# Patient Record
Sex: Female | Born: 1950
Health system: Southern US, Community
[De-identification: ages and names within clinical notes are randomized; demographics above are authoritative.]

## PROBLEM LIST (undated history)

## (undated) DIAGNOSIS — R7309 Other abnormal glucose: Secondary | ICD-10-CM

## (undated) DIAGNOSIS — R7989 Other specified abnormal findings of blood chemistry: Secondary | ICD-10-CM

## (undated) DIAGNOSIS — E785 Hyperlipidemia, unspecified: Secondary | ICD-10-CM

## (undated) DIAGNOSIS — M810 Age-related osteoporosis without current pathological fracture: Secondary | ICD-10-CM

## (undated) DIAGNOSIS — R748 Abnormal levels of other serum enzymes: Secondary | ICD-10-CM

## (undated) DIAGNOSIS — M109 Gout, unspecified: Secondary | ICD-10-CM

## (undated) DIAGNOSIS — I10 Essential (primary) hypertension: Secondary | ICD-10-CM

## (undated) DIAGNOSIS — H9193 Unspecified hearing loss, bilateral: Secondary | ICD-10-CM

## (undated) DIAGNOSIS — N904 Leukoplakia of vulva: Secondary | ICD-10-CM

## (undated) DIAGNOSIS — E039 Hypothyroidism, unspecified: Secondary | ICD-10-CM

## (undated) DIAGNOSIS — L723 Sebaceous cyst: Secondary | ICD-10-CM

## (undated) HISTORY — DX: Gout, unspecified: M10.9

## (undated) HISTORY — DX: Other abnormal glucose: R73.09

## (undated) HISTORY — DX: Unspecified hearing loss, bilateral: H91.93

## (undated) HISTORY — DX: Hyperlipidemia, unspecified: E78.5

## (undated) HISTORY — DX: Other specified abnormal findings of blood chemistry: R79.89

## (undated) HISTORY — DX: Age-related osteoporosis without current pathological fracture: M81.0

## (undated) HISTORY — PX: OTHER SURGICAL HISTORY: SHX169

## (undated) HISTORY — DX: Sebaceous cyst: L72.3

## (undated) HISTORY — DX: Essential (primary) hypertension: I10

## (undated) HISTORY — DX: Abnormal levels of other serum enzymes: R74.8

## (undated) HISTORY — DX: Hypothyroidism, unspecified: E03.9

## (undated) HISTORY — DX: Leukoplakia of vulva: N90.4

---

## 1979-09-25 HISTORY — PX: TOTAL ABDOMINAL HYSTERECTOMY: SHX209

## 1983-09-25 HISTORY — PX: BARTHOLIN GLAND CYST EXCISION: SHX565

## 1999-07-27 ENCOUNTER — Other Ambulatory Visit: Admission: RE | Admit: 1999-07-27 | Discharge: 1999-07-27 | Payer: Self-pay | Admitting: Obstetrics and Gynecology

## 1999-09-06 ENCOUNTER — Encounter: Payer: Self-pay | Admitting: Obstetrics and Gynecology

## 1999-09-06 ENCOUNTER — Encounter: Admission: RE | Admit: 1999-09-06 | Discharge: 1999-09-06 | Payer: Self-pay | Admitting: Obstetrics and Gynecology

## 2000-08-21 ENCOUNTER — Other Ambulatory Visit: Admission: RE | Admit: 2000-08-21 | Discharge: 2000-08-21 | Payer: Self-pay | Admitting: Obstetrics and Gynecology

## 2000-09-11 ENCOUNTER — Encounter: Admission: RE | Admit: 2000-09-11 | Discharge: 2000-09-11 | Payer: Self-pay | Admitting: Obstetrics and Gynecology

## 2000-09-11 ENCOUNTER — Encounter: Payer: Self-pay | Admitting: Obstetrics and Gynecology

## 2001-08-27 ENCOUNTER — Other Ambulatory Visit: Admission: RE | Admit: 2001-08-27 | Discharge: 2001-08-27 | Payer: Self-pay | Admitting: Obstetrics and Gynecology

## 2001-09-19 ENCOUNTER — Encounter: Admission: RE | Admit: 2001-09-19 | Discharge: 2001-09-19 | Payer: Self-pay | Admitting: Obstetrics and Gynecology

## 2001-09-19 ENCOUNTER — Encounter: Payer: Self-pay | Admitting: Obstetrics and Gynecology

## 2004-12-22 ENCOUNTER — Encounter: Admission: RE | Admit: 2004-12-22 | Discharge: 2004-12-22 | Payer: Self-pay | Admitting: Obstetrics and Gynecology

## 2006-02-20 ENCOUNTER — Encounter: Admission: RE | Admit: 2006-02-20 | Discharge: 2006-02-20 | Payer: Self-pay | Admitting: Obstetrics and Gynecology

## 2007-05-14 ENCOUNTER — Encounter: Admission: RE | Admit: 2007-05-14 | Discharge: 2007-05-14 | Payer: Self-pay | Admitting: Obstetrics and Gynecology

## 2007-07-30 ENCOUNTER — Encounter: Admission: RE | Admit: 2007-07-30 | Discharge: 2007-07-30 | Payer: Self-pay | Admitting: Obstetrics and Gynecology

## 2008-06-16 ENCOUNTER — Encounter: Admission: RE | Admit: 2008-06-16 | Discharge: 2008-06-16 | Payer: Self-pay | Admitting: Obstetrics and Gynecology

## 2009-07-13 ENCOUNTER — Encounter: Admission: RE | Admit: 2009-07-13 | Discharge: 2009-07-13 | Payer: Self-pay | Admitting: Obstetrics and Gynecology

## 2009-10-25 LAB — HM COLONOSCOPY: HM Colonoscopy: NORMAL

## 2009-10-25 LAB — HM DEXA SCAN: HM Dexa Scan: NORMAL

## 2009-11-02 ENCOUNTER — Encounter: Admission: RE | Admit: 2009-11-02 | Discharge: 2009-11-02 | Payer: Self-pay | Admitting: Obstetrics and Gynecology

## 2010-07-14 ENCOUNTER — Encounter: Admission: RE | Admit: 2010-07-14 | Discharge: 2010-07-14 | Payer: Self-pay | Admitting: Obstetrics and Gynecology

## 2010-10-16 ENCOUNTER — Encounter: Payer: Self-pay | Admitting: Obstetrics and Gynecology

## 2011-06-18 ENCOUNTER — Other Ambulatory Visit: Payer: Self-pay | Admitting: Obstetrics and Gynecology

## 2011-06-18 DIAGNOSIS — Z1231 Encounter for screening mammogram for malignant neoplasm of breast: Secondary | ICD-10-CM

## 2011-07-18 ENCOUNTER — Ambulatory Visit: Payer: Self-pay

## 2011-07-25 ENCOUNTER — Ambulatory Visit
Admission: RE | Admit: 2011-07-25 | Discharge: 2011-07-25 | Disposition: A | Discharge status: Home / Self Care | Payer: BC Managed Care – PPO | Source: Ambulatory Visit | Attending: Obstetrics and Gynecology | Admitting: Obstetrics and Gynecology

## 2011-07-25 DIAGNOSIS — Z1231 Encounter for screening mammogram for malignant neoplasm of breast: Secondary | ICD-10-CM

## 2011-12-25 ENCOUNTER — Other Ambulatory Visit: Payer: Self-pay | Admitting: Obstetrics and Gynecology

## 2012-01-04 ENCOUNTER — Ambulatory Visit
Admission: RE | Admit: 2012-01-04 | Discharge: 2012-01-04 | Disposition: A | Discharge status: Home / Self Care | Payer: BC Managed Care – PPO | Source: Ambulatory Visit | Attending: Obstetrics and Gynecology | Admitting: Obstetrics and Gynecology

## 2012-06-16 ENCOUNTER — Other Ambulatory Visit: Payer: Self-pay | Admitting: Obstetrics and Gynecology

## 2012-06-16 DIAGNOSIS — Z1231 Encounter for screening mammogram for malignant neoplasm of breast: Secondary | ICD-10-CM

## 2012-07-31 ENCOUNTER — Ambulatory Visit
Admission: RE | Admit: 2012-07-31 | Discharge: 2012-07-31 | Disposition: A | Discharge status: Home / Self Care | Payer: BC Managed Care – PPO | Source: Ambulatory Visit | Attending: Obstetrics and Gynecology | Admitting: Obstetrics and Gynecology

## 2012-07-31 DIAGNOSIS — Z1231 Encounter for screening mammogram for malignant neoplasm of breast: Secondary | ICD-10-CM

## 2012-08-05 ENCOUNTER — Other Ambulatory Visit: Payer: Self-pay | Admitting: Obstetrics and Gynecology

## 2012-08-05 DIAGNOSIS — R928 Other abnormal and inconclusive findings on diagnostic imaging of breast: Secondary | ICD-10-CM

## 2012-08-08 ENCOUNTER — Ambulatory Visit
Admission: RE | Admit: 2012-08-08 | Discharge: 2012-08-08 | Disposition: A | Discharge status: Home / Self Care | Payer: BC Managed Care – PPO | Source: Ambulatory Visit | Attending: Obstetrics and Gynecology | Admitting: Obstetrics and Gynecology

## 2012-08-08 DIAGNOSIS — R928 Other abnormal and inconclusive findings on diagnostic imaging of breast: Secondary | ICD-10-CM

## 2012-08-08 LAB — HM MAMMOGRAPHY

## 2012-11-25 ENCOUNTER — Encounter: Payer: Self-pay | Admitting: Obstetrics and Gynecology

## 2012-12-17 ENCOUNTER — Telehealth: Payer: Self-pay | Admitting: Obstetrics and Gynecology

## 2012-12-24 ENCOUNTER — Encounter: Payer: Self-pay | Admitting: Obstetrics and Gynecology

## 2012-12-24 ENCOUNTER — Ambulatory Visit (INDEPENDENT_AMBULATORY_CARE_PROVIDER_SITE_OTHER): Payer: BC Managed Care – PPO | Admitting: Obstetrics and Gynecology

## 2012-12-24 VITALS — BP 128/76 | Ht 63.75 in | Wt 191.0 lb

## 2012-12-24 DIAGNOSIS — Z01419 Encounter for gynecological examination (general) (routine) without abnormal findings: Secondary | ICD-10-CM

## 2012-12-24 DIAGNOSIS — I10 Essential (primary) hypertension: Secondary | ICD-10-CM

## 2012-12-24 DIAGNOSIS — H9193 Unspecified hearing loss, bilateral: Secondary | ICD-10-CM

## 2012-12-24 DIAGNOSIS — Z9079 Acquired absence of other genital organ(s): Secondary | ICD-10-CM

## 2012-12-24 DIAGNOSIS — E7800 Pure hypercholesterolemia, unspecified: Secondary | ICD-10-CM | POA: Insufficient documentation

## 2012-12-24 DIAGNOSIS — Z90710 Acquired absence of both cervix and uterus: Secondary | ICD-10-CM

## 2012-12-24 DIAGNOSIS — H9190 Unspecified hearing loss, unspecified ear: Secondary | ICD-10-CM

## 2012-12-24 NOTE — Patient Instructions (Signed)

## 2012-12-24 NOTE — Progress Notes (Signed)
62 y.o. Divorced but now married Caucasian female   G2P2002 here for annual exam.    Patient's last menstrual period was 09/25/1979.          Sexually active: yes  The current method of family planning is status post hysterectomy.    Exercising:  not reguarly Last mammogram:  08/08/2012 benign calcification Lt breast Last pap smear: 2002 nl History of abnormal pap:  none Smoking:no Alcohol: 1-2 times a year Last colonoscopy: 10/2009 nl every 52yrs Last Bone Density:  01/04/2012 -2.2 at spine   -2.0 at hip Last tetanus shot: 2012 Last cholesterol check: 12/19/12  Hgb:  pcp              Urine:pcp    Health Maintenance  Topic Date Due  . Influenza Vaccine  1951-08-06  . Pap Smear  06/10/1968  . Tetanus/tdap  06/10/1969  . Colonoscopy  06/10/2000  . Zostavax  06/10/2010  . Mammogram  08/08/2014    Family History  Problem Relation Age of Onset  . Lymphoma Mother     Non Hodgkins  . Hypertension Mother   . Alzheimer's disease Mother   . ALS Father   . Thrombophlebitis Other     There is no problem list on file for this patient.   Past Medical History  Diagnosis Date  . Hypertension   . Hyperlipidemia   . Osteoporosis   . Hearing loss of both ears     Right ear hears less well.  . Hypothyroidism     Not on medication.    Past Surgical History  Procedure Laterality Date  . Bartholin gland removal N/A   . Total abdominal hysterectomy  1981    right salpingo-oophorectomy and appendectomy - dysfunctional uterine bleeding    Allergies: Review of patient's allergies indicates no known allergies.  Current Outpatient Prescriptions  Medication Sig Dispense Refill  . atenolol (TENORMIN) 50 MG tablet Take 100 mg by mouth 2 (two) times daily.      . Calcium Carbonate-Vitamin D (CALCIUM-VITAMIN D) 500-200 MG-UNIT per tablet Take 1 tablet by mouth 2 (two) times daily with a meal.      . cetirizine (ZYRTEC) 10 MG tablet Take 10 mg by mouth daily. Dosage not listed.      .  hydrochlorothiazide (HYDRODIURIL) 25 MG tablet Take 25 mg by mouth daily.      . Multiple Vitamin (MULTIVITAMIN) tablet Take 1 tablet by mouth daily.      . pantoprazole (PROTONIX) 40 MG tablet Take 40 mg by mouth daily.      . simvastatin (ZOCOR) 20 MG tablet Take 20 mg by mouth every evening.       No current facility-administered medications for this visit.    ROS: Pertinent items are noted in HPI.  Exam:    BP 128/76  Ht 5' 3.75" (1.619 m)  Wt 191 lb (86.637 kg)  BMI 33.05 kg/m2  LMP 09/25/1979  Up 7 pounds since last year Wt Readings from Last 3 Encounters:  12/24/12 191 lb (86.637 kg)     Ht Readings from Last 3 Encounters:  12/24/12 5' 3.75" (1.619 m)  Height stable since last year   General appearance: alert, cooperative and appears stated age Head: Normocephalic, without obvious abnormality, atraumatic Neck: no adenopathy, supple, symmetrical, trachea midline and thyroid not enlarged, symmetric, no tenderness/mass/nodules Lungs: clear to auscultation bilaterally Breasts: Inspection negative, No nipple retraction or dimpling, No nipple discharge or bleeding, No axillary or supraclavicular adenopathy, Normal to palpation  without dominant masses Heart: regular rate and rhythm Abdomen: soft, non-tender; bowel sounds normal; no masses,  no organomegaly Extremities: extremities normal, atraumatic, no cyanosis or edema Skin: Skin color, texture, turgor normal. No rashes or lesions Lymph nodes: Cervical, supraclavicular, and axillary nodes normal. No abnormal inguinal nodes palpated Neurologic: Grossly normal   Pelvic: External genitalia:  no lesions              Urethra:  normal appearing urethra with no masses, tenderness or lesions              Bartholins and Skenes: normal                 Vagina: normal appearing vagina with normal color and discharge, no lesions                          Pap taken: no        Bimanual Exam:   Adnexa: normal adnexa in size, nontender  and no masses                                      Rectovaginal: Confirms                                      Anus:  normal sphincter tone, no lesions  A: normal menopausal exam, not on HRT.        S/p TAH/LSO/Appy for DUB      bilat hearing loss, wears hearing aids      Osteoporosis, stable since 2008     P: mammogram counseled on breast self exam, mammography screening, osteoporosis, adequate intake of calcium and vitamin D, diet and exercise return annually or prn     An After Visit Summary was printed and given to the patient.

## 2012-12-30 ENCOUNTER — Other Ambulatory Visit: Payer: Self-pay | Admitting: Obstetrics and Gynecology

## 2012-12-30 DIAGNOSIS — R921 Mammographic calcification found on diagnostic imaging of breast: Secondary | ICD-10-CM

## 2013-01-29 ENCOUNTER — Ambulatory Visit
Admission: RE | Admit: 2013-01-29 | Discharge: 2013-01-29 | Disposition: A | Discharge status: Home / Self Care | Payer: BC Managed Care – PPO | Source: Ambulatory Visit | Attending: Obstetrics and Gynecology | Admitting: Obstetrics and Gynecology

## 2013-01-29 DIAGNOSIS — R921 Mammographic calcification found on diagnostic imaging of breast: Secondary | ICD-10-CM

## 2013-02-24 ENCOUNTER — Telehealth: Payer: Self-pay | Admitting: *Deleted

## 2013-03-21 ENCOUNTER — Encounter: Payer: Self-pay | Admitting: Obstetrics and Gynecology

## 2013-07-09 ENCOUNTER — Other Ambulatory Visit: Payer: Self-pay | Admitting: Gynecology

## 2013-07-09 DIAGNOSIS — R921 Mammographic calcification found on diagnostic imaging of breast: Secondary | ICD-10-CM

## 2013-07-30 ENCOUNTER — Other Ambulatory Visit: Payer: Self-pay

## 2013-08-03 ENCOUNTER — Telehealth: Payer: Self-pay | Admitting: Nurse Practitioner

## 2013-08-03 NOTE — Telephone Encounter (Signed)
Spoke with patient. She has a hx of bartholin's cysts that was infected "about 20 years ago". Patient having 3 days of vaginal pain and soreness. No fevers. No drainage, no vaginal discharge. Appointment with Lauro Franklin, FNP Scheduled for 11/11. Routing to provider for final review. Patient agreeable to disposition. Will close encounter

## 2013-08-03 NOTE — Telephone Encounter (Signed)
Patient calling thinking she may possibly have an infected bartholn gland. Please advise.

## 2013-08-04 ENCOUNTER — Ambulatory Visit (INDEPENDENT_AMBULATORY_CARE_PROVIDER_SITE_OTHER): Payer: BC Managed Care – PPO | Admitting: Nurse Practitioner

## 2013-08-04 ENCOUNTER — Encounter: Payer: Self-pay | Admitting: Nurse Practitioner

## 2013-08-04 VITALS — BP 120/84 | HR 68 | Temp 98.1°F | Ht 63.75 in | Wt 196.0 lb

## 2013-08-04 DIAGNOSIS — L723 Sebaceous cyst: Secondary | ICD-10-CM

## 2013-08-04 MED ORDER — CEPHALEXIN 500 MG PO CAPS: 500.0000 mg | ORAL_CAPSULE | Freq: Two times a day (BID) | ORAL | Status: DC

## 2013-08-04 MED ORDER — CEPHALEXIN 500 MG PO CAPS: ORAL_CAPSULE | ORAL | Status: DC

## 2013-08-04 NOTE — Patient Instructions (Signed)

## 2013-08-04 NOTE — Progress Notes (Signed)
Subjective:     Patient ID: Kristin Houston, female   DOB: 07-13-1951, 62 y.o.   MRN: [id number]  HPI   This 62 yo WM Fe presents with a swollen area on right labia since Saturday November 1st..  She states no trauma, no change in personal products, or recent SA.  She went shopping on Saturday but felt no pain.   Later in the day felt like something was wrong.  By Sunday am the labia was quite swollen and painful.  Yesterday she did apply warm compress and a sitz bath and is somewhat smaller.  She has no history of HSV or exposure risk.  She did have a left Bartholin gland cyst 20 years ago. States this feels different.  Denies urinary symptoms, fever/ chills.   Review of Systems  Constitutional: Negative for fever, chills and fatigue.  HENT: Negative.   Respiratory: Negative.   Cardiovascular: Negative.   Gastrointestinal: Negative.  Negative for nausea, vomiting, abdominal pain, diarrhea, constipation, blood in stool and rectal pain.  Genitourinary: Positive for genital sores. Negative for dysuria, urgency, frequency, hematuria, flank pain, decreased urine volume, vaginal bleeding, vaginal discharge, vaginal pain, menstrual problem and pelvic pain.  Musculoskeletal: Negative.   Skin: Positive for wound.  Neurological: Negative.   Psychiatric/Behavioral: Negative.        Objective:   Physical Exam  Constitutional: She is oriented to person, place, and time. She appears well-developed and well-nourished. No distress.  Pulmonary/Chest: Effort normal.  Abdominal: Soft. She exhibits no distension. There is no tenderness. There is no rebound and no guarding.  Genitourinary:     Right labia with a sebaceous cyst about 1.5 cm size that is soft but no exudate can be expressed.  She has swelling at 2 cm size in circumference. Normal Bartholin glands bilaterally.  No vaginal discharge.  One small area is opened to obtain a culture with small amount of exudate expressed.  Wound culture sent to  lab.  Neurological: She is alert and oriented to person, place, and time.  Psychiatric: She has a normal mood and affect. Her behavior is normal. Judgment and thought content normal.       Assessment:     Sebaceous cyst right labia    Plan:     Warm compresses and sitz bath 4 X daily Will start on Keflex 500 mg BID for a week. Will call her with culture results and get a progress report. She is to call if symptoms worsens in any way.

## 2013-08-06 NOTE — Progress Notes (Signed)
Encounter reviewed by Dr. Drayk Humbarger Silva.  

## 2013-08-07 LAB — WOUND CULTURE: Organism ID, Bacteria: NORMAL

## 2013-08-10 NOTE — Telephone Encounter (Signed)
Spoke with patient. She feels much improved. Advised of wound culture results. Patient will continue on rx and call back if anything changes.

## 2013-08-10 NOTE — Telephone Encounter (Signed)
Patient calling for results please?

## 2013-08-10 NOTE — Telephone Encounter (Signed)
Result Note    Let patient know no MRSA - get a progress report the med's she is on should be helping. If not needs another look, or if still swollen needs another look.      Attached to    WOUND CULTURE (Order# [id number])                  Wound culture   Status: Final result Visible to patient: This result is not viewable by the patient. Next appt: 09/30/2013 at 02:45 PM in Radiology ([facility]) Dx: Sebaceous cyst         Notes Recorded by Lauro Franklin, FNP on 08/06/2013 at 5:29 PM Let patient know no MRSA - get a progress report the med's she is on should be helping. If not needs another look, or if still swollen needs another look.

## 2013-09-28 ENCOUNTER — Other Ambulatory Visit: Payer: Self-pay | Admitting: Obstetrics and Gynecology

## 2013-09-28 DIAGNOSIS — R921 Mammographic calcification found on diagnostic imaging of breast: Secondary | ICD-10-CM

## 2013-09-30 ENCOUNTER — Ambulatory Visit
Admission: RE | Admit: 2013-09-30 | Discharge: 2013-09-30 | Disposition: A | Discharge status: Home / Self Care | Payer: BC Managed Care – PPO | Source: Ambulatory Visit | Attending: Gynecology | Admitting: Gynecology

## 2013-09-30 DIAGNOSIS — R921 Mammographic calcification found on diagnostic imaging of breast: Secondary | ICD-10-CM

## 2013-12-29 ENCOUNTER — Encounter: Payer: Self-pay | Admitting: Nurse Practitioner

## 2013-12-31 ENCOUNTER — Encounter: Payer: Self-pay | Admitting: Nurse Practitioner

## 2013-12-31 ENCOUNTER — Ambulatory Visit (INDEPENDENT_AMBULATORY_CARE_PROVIDER_SITE_OTHER): Payer: BC Managed Care – PPO | Admitting: Nurse Practitioner

## 2013-12-31 VITALS — BP 126/82 | HR 68 | Ht 64.5 in | Wt 193.0 lb

## 2013-12-31 DIAGNOSIS — Z01419 Encounter for gynecological examination (general) (routine) without abnormal findings: Secondary | ICD-10-CM

## 2013-12-31 DIAGNOSIS — Z0000 Encounter for general adult medical examination without abnormal findings: Secondary | ICD-10-CM

## 2013-12-31 DIAGNOSIS — Z1211 Encounter for screening for malignant neoplasm of colon: Secondary | ICD-10-CM

## 2013-12-31 DIAGNOSIS — R82998 Other abnormal findings in urine: Secondary | ICD-10-CM

## 2013-12-31 DIAGNOSIS — R8290 Unspecified abnormal findings in urine: Secondary | ICD-10-CM

## 2013-12-31 LAB — POCT URINALYSIS DIPSTICK
Bilirubin, UA: NEGATIVE
Glucose, UA: NEGATIVE
KETONES UA: NEGATIVE
Nitrite, UA: NEGATIVE
Protein, UA: NEGATIVE
RBC UA: NEGATIVE
UROBILINOGEN UA: NEGATIVE
pH, UA: 6

## 2013-12-31 LAB — HEMOGLOBIN, FINGERSTICK: Hemoglobin, fingerstick: 15.8 g/dL (ref 12.0–16.0)

## 2013-12-31 NOTE — Progress Notes (Addendum)
Patient ID: Kristin Houston, female   DOB: 26-Feb-1951, 63 y.o.   MRN: [id number] 63 y.o. G32P2002 Married Caucasian Fe here for annual exam.  No new health issues.  No vaginal dryness. No urinary symptoms.  Patient's last menstrual period was 09/25/1979.          Sexually active: yes  The current method of family planning is status post hysterectomy.    Exercising: no  The patient does not participate in regular exercise at present. Smoker:  no  Health Maintenance: Pap:  TAH 1981 MMG:  09/30/13, Bi-Rads 1: negative Colonoscopy:  2006, normal, repeat in 10 years IFOB is given BMD:  01/04/12, -2.2/-2.0 TDAP: 2012 Labs:  HB:  15.8 Urine:  2+ leuk's   reports that she has never smoked. She does not have any smokeless tobacco history on file. She reports that she drinks alcohol. She reports that she does not use illicit drugs.  Past Medical History  Diagnosis Date  . Hypertension   . Hyperlipidemia   . Osteoporosis   . Hearing loss of both ears     Right ear hears less well.  . Hypothyroidism     Not on medication.    Past Surgical History  Procedure Laterality Date  . Bartholin gland cyst excision N/A 1985  . Total abdominal hysterectomy  1981    left salpingo-oophorectomy and appendectomy - dysfunctional uterine bleeding    Current Outpatient Prescriptions  Medication Sig Dispense Refill  . atenolol (TENORMIN) 50 MG tablet Take 100 mg by mouth 2 (two) times daily.      . Calcium Carbonate-Vitamin D (CALCIUM-VITAMIN D) 500-200 MG-UNIT per tablet Take 1 tablet by mouth 2 (two) times daily with a meal.      . cetirizine (ZYRTEC) 10 MG tablet Take 10 mg by mouth daily. Dosage not listed.      . hydrochlorothiazide (HYDRODIURIL) 25 MG tablet Take 25 mg by mouth daily.      . Multiple Vitamin (MULTIVITAMIN) tablet Take 1 tablet by mouth daily.      . pantoprazole (PROTONIX) 40 MG tablet Take 40 mg by mouth daily.      . simvastatin (ZOCOR) 20 MG tablet Take 20 mg by mouth every  evening.       No current facility-administered medications for this visit.    Family History  Problem Relation Age of Onset  . Lymphoma Mother     Non Hodgkins  . Hypertension Mother   . Alzheimer's disease Mother   . Osteoporosis Mother   . ALS Father   . Thrombophlebitis Other     ROS:  Pertinent items are noted in HPI.  Otherwise, a comprehensive ROS was negative.  Exam:   BP 126/82  Pulse 68  Ht 5' 4.5" (1.638 m)  Wt 193 lb (87.544 kg)  BMI 32.63 kg/m2  LMP 09/25/1979 Height: 5' 4.5" (163.8 cm)  Ht Readings from Last 3 Encounters:  12/31/13 5' 4.5" (1.638 m)  08/04/13 5' 3.75" (1.619 m)  12/24/12 5' 3.75" (1.619 m)    General appearance: alert, cooperative and appears stated age Head: Normocephalic, without obvious abnormality, atraumatic Neck: no adenopathy, supple, symmetrical, trachea midline and thyroid normal to inspection and palpation Lungs: clear to auscultation bilaterally Breasts: normal appearance, no masses or tenderness Heart: regular rate and rhythm Abdomen: soft, non-tender; no masses,  no organomegaly Extremities: extremities normal, atraumatic, no cyanosis or edema Skin: Skin color, texture, turgor normal. No rashes or lesions Lymph nodes: Cervical, supraclavicular, and axillary nodes  normal. No abnormal inguinal nodes palpated Neurologic: Grossly normal   Pelvic: External genitalia:  no lesions              Urethra:  normal appearing urethra with no masses, tenderness or lesions              Bartholin's and Skene's: normal                 Vagina: normal appearing vagina with normal color and discharge, no lesions              Cervix: absent              Pap taken: no Bimanual Exam:  Uterus:  uterus absent              Adnexa: no mass, fullness, tenderness               Rectovaginal: Confirms               Anus:  normal sphincter tone, no lesions  A:  Well Woman with normal exam  S/P TAH, LSO secondary to AUB 1981 - ERT about 5 years then  dc 12/02  HTN, GERD  Osteoporosis improved now at osteopenia on conservative management, no bisphosphonate  Bilateral hearing loss with aids  R/O UTI asymptomatic - no treatment started  P:   Pap smear as per guidelines not done  Will send urine culture and follow   Mammogram due 1/16  IFOB given  Counseled on breast self exam, mammography screening, osteoporosis, adequate intake of calcium and vitamin D, diet and exercise return annually or prn  An After Visit Summary was printed and given to the patient.

## 2013-12-31 NOTE — Patient Instructions (Signed)

## 2014-01-01 ENCOUNTER — Ambulatory Visit: Payer: BC Managed Care – PPO | Admitting: Obstetrics and Gynecology

## 2014-01-01 LAB — URINE CULTURE: Colony Count: 70000

## 2014-01-04 ENCOUNTER — Telehealth: Payer: Self-pay | Admitting: *Deleted

## 2014-01-04 NOTE — Telephone Encounter (Signed)
I have attempted to contact this patient by phone with the following results: left message to return my call on answering machine (home/mobile per DPR).  

## 2014-01-04 NOTE — Telephone Encounter (Signed)
Pt returning call. Call pt's work number before 5

## 2014-01-04 NOTE — Progress Notes (Signed)
Encounter reviewed by Dr. Borna Wessinger Silva.  

## 2014-01-04 NOTE — Telephone Encounter (Signed)
Message copied by Luisa DagoPHILLIPS, Rosine Solecki C on Mon Jan 04, 2014  2:07 PM ------      Message from: GRUBB, PATRICIA R      Created: Mon Jan 04, 2014  8:57 AM       Let patient know that urine culture shows vaginal contamination but if gets any urinary symptoms to call back. ------

## 2014-01-04 NOTE — Telephone Encounter (Signed)
Returning a call to Stephanie. °

## 2014-01-05 ENCOUNTER — Encounter: Payer: Self-pay | Admitting: Nurse Practitioner

## 2014-01-05 NOTE — Telephone Encounter (Signed)
Pt notified in result note.  

## 2014-01-08 LAB — FECAL OCCULT BLOOD, IMMUNOCHEMICAL: IFOBT: NEGATIVE

## 2014-01-08 NOTE — Addendum Note (Signed)
Addended by: Luisa DagoPHILLIPS, Ananya Mccleese C on: 01/08/2014 09:32 AM   Modules accepted: Orders

## 2014-01-14 ENCOUNTER — Ambulatory Visit: Payer: BC Managed Care – PPO | Admitting: Nurse Practitioner

## 2014-06-07 ENCOUNTER — Encounter: Payer: Self-pay | Admitting: Obstetrics and Gynecology

## 2014-07-26 ENCOUNTER — Encounter: Payer: Self-pay | Admitting: Nurse Practitioner

## 2014-09-23 ENCOUNTER — Other Ambulatory Visit: Payer: Self-pay

## 2014-09-23 DIAGNOSIS — Z1231 Encounter for screening mammogram for malignant neoplasm of breast: Secondary | ICD-10-CM

## 2014-12-08 ENCOUNTER — Ambulatory Visit
Admission: RE | Admit: 2014-12-08 | Discharge: 2014-12-08 | Disposition: A | Discharge status: Home / Self Care | Payer: BLUE CROSS/BLUE SHIELD | Source: Ambulatory Visit

## 2014-12-08 DIAGNOSIS — Z1231 Encounter for screening mammogram for malignant neoplasm of breast: Secondary | ICD-10-CM

## 2014-12-09 ENCOUNTER — Other Ambulatory Visit: Payer: Self-pay | Admitting: Obstetrics and Gynecology

## 2014-12-09 DIAGNOSIS — R928 Other abnormal and inconclusive findings on diagnostic imaging of breast: Secondary | ICD-10-CM

## 2014-12-13 ENCOUNTER — Ambulatory Visit
Admission: RE | Admit: 2014-12-13 | Discharge: 2014-12-13 | Disposition: A | Discharge status: Home / Self Care | Payer: BLUE CROSS/BLUE SHIELD | Source: Ambulatory Visit | Attending: Obstetrics and Gynecology | Admitting: Obstetrics and Gynecology

## 2014-12-13 DIAGNOSIS — R928 Other abnormal and inconclusive findings on diagnostic imaging of breast: Secondary | ICD-10-CM

## 2015-01-07 ENCOUNTER — Ambulatory Visit: Payer: BC Managed Care – PPO | Admitting: Obstetrics and Gynecology

## 2015-01-13 ENCOUNTER — Ambulatory Visit: Payer: BC Managed Care – PPO

## 2015-01-13 ENCOUNTER — Ambulatory Visit: Payer: BC Managed Care – PPO | Admitting: Nurse Practitioner

## 2015-01-25 ENCOUNTER — Ambulatory Visit: Payer: Self-pay | Admitting: Nurse Practitioner

## 2015-05-19 ENCOUNTER — Ambulatory Visit (INDEPENDENT_AMBULATORY_CARE_PROVIDER_SITE_OTHER): Payer: BLUE CROSS/BLUE SHIELD | Admitting: Obstetrics and Gynecology

## 2015-05-19 ENCOUNTER — Encounter: Payer: Self-pay | Admitting: Obstetrics and Gynecology

## 2015-05-19 VITALS — BP 120/78 | HR 64 | Resp 16 | Ht 64.0 in | Wt 195.4 lb

## 2015-05-19 DIAGNOSIS — Z0000 Encounter for general adult medical examination without abnormal findings: Secondary | ICD-10-CM | POA: Diagnosis not present

## 2015-05-19 DIAGNOSIS — M8580 Other specified disorders of bone density and structure, unspecified site: Secondary | ICD-10-CM

## 2015-05-19 DIAGNOSIS — Z01419 Encounter for gynecological examination (general) (routine) without abnormal findings: Secondary | ICD-10-CM | POA: Diagnosis not present

## 2015-05-19 LAB — POCT URINALYSIS DIPSTICK
BILIRUBIN UA: NEGATIVE
GLUCOSE UA: NEGATIVE
Ketones, UA: NEGATIVE
Leukocytes, UA: NEGATIVE
Nitrite, UA: NEGATIVE
Protein, UA: NEGATIVE
RBC UA: NEGATIVE
UROBILINOGEN UA: NEGATIVE
pH, UA: 5

## 2015-05-19 NOTE — Patient Instructions (Signed)

## 2015-05-19 NOTE — Progress Notes (Signed)
Patient ID: Kristin Houston, female   DOB: 1950-11-28, 64 y.o.   MRN: [id number] 64 y.o. G60P2002 Divorced Caucasian female here for annual exam.    Status post TAH/LSO for benign ovarian cyst.  Still had right tube and ovary.   No HRT currently.   A little less energy overall.  Hx of hypothyroidism in the past but not on medication for 4 - 5 years.  Has labs twice a year with PCP.   Episode of gout in the last year.  Takes indomethacin for the pain if occurs.   Works for a [employer].   PCP: Fara Chute, MD    Patient's last menstrual period was 09/25/1979.          Sexually active: Yes.   female The current method of family planning is status post hysterectomy.    Exercising: Yes.    walking. Smoker:  no  Health Maintenance: Pap:  Years ago--normal History of abnormal Pap:  no MMG:  12-08-14 possible asymmetry Rt.breast;Lt.breast negative:US and Diag MMG Rt.breast revealed benign appearing fat necrosis Rt.breast at 12 o'clock.  This is compatible with chest trauma as reported by the patient and no evidence for malignancy:BiRads 2:[facility]. Colonoscopy:  04/2015 normal with Dr. Kinnie Scales.  Next due 04/2025. BMD: 01-04-12 Result  Osteopenia:[facility] TDaP:  2012 Screening Labs:  Hb today: PCP, Urine today: Neg   reports that she has never smoked. She does not have any smokeless tobacco history on file. She reports that she does not drink alcohol or use illicit drugs.  Past Medical History  Diagnosis Date  . Hypertension   . Hyperlipidemia   . Osteoporosis   . Hearing loss of both ears     Right ear hears less well.  . Hypothyroidism     Not on medication.  . Gout of big toe     Past Surgical History  Procedure Laterality Date  . Bartholin gland cyst excision N/A 1985  . Total abdominal hysterectomy  1981    left salpingo-oophorectomy and appendectomy - dysfunctional uterine bleeding  . Dental implants      Current Outpatient Prescriptions  Medication  Sig Dispense Refill  . atenolol (TENORMIN) 100 MG tablet Take 1 tablet by mouth 2 (two) times daily.  2  . Calcium Carbonate-Vitamin D (CALCIUM-VITAMIN D) 500-200 MG-UNIT per tablet Take 1 tablet by mouth 2 (two) times daily with a meal.    . cetirizine (ZYRTEC) 10 MG tablet Take 10 mg by mouth daily. Dosage not listed.    . hydrochlorothiazide (HYDRODIURIL) 25 MG tablet Take 25 mg by mouth daily.    . indomethacin (INDOCIN) 50 MG capsule Take 1 capsule by mouth as needed. 1 TABLET prn as needed for Gout flare up.  0  . Multiple Vitamin (MULTIVITAMIN) tablet Take 1 tablet by mouth daily.    . simvastatin (ZOCOR) 20 MG tablet Take 20 mg by mouth every evening.     No current facility-administered medications for this visit.    Family History  Problem Relation Age of Onset  . Lymphoma Mother     Non Hodgkins  . Hypertension Mother   . Alzheimer's disease Mother   . Osteoporosis Mother   . ALS Father   . Thrombophlebitis Other     ROS:  Pertinent items are noted in HPI.  Otherwise, a comprehensive ROS was negative.  Exam:   BP 120/78 mmHg  Pulse 64  Resp 16  Ht  (1.626 m)  Wt  195 lb 6.4 oz (88.633 kg)  BMI 33.52 kg/m2  LMP 09/25/1979    General appearance: alert, cooperative and appears stated age Head: Normocephalic, without obvious abnormality, atraumatic Neck: no adenopathy, supple, symmetrical, trachea midline and thyroid normal to inspection and palpation Lungs: clear to auscultation bilaterally Breasts: normal appearance, no masses or tenderness, Inspection negative, No nipple retraction or dimpling, No nipple discharge or bleeding, No axillary or supraclavicular adenopathy Heart: regular rate and rhythm Abdomen: soft, non-tender; bowel sounds normal; no masses,  no organomegaly Extremities: extremities normal, atraumatic, no cyanosis or edema Skin: Skin color, texture, turgor normal. No rashes or lesions Lymph nodes: Cervical, supraclavicular, and axillary nodes  normal. No abnormal inguinal nodes palpated Neurologic: Grossly normal  Pelvic: External genitalia:  no lesions              Urethra:  normal appearing urethra with no masses, tenderness or lesions              Bartholins and Skenes: normal                 Vagina: normal appearing vagina with normal color and discharge, no lesions              Cervix: absent              Pap taken: No. Bimanual Exam:  Uterus:  uterus absent              Adnexa: no mass, fullness, tenderness              Rectovaginal: Yes.  .  Confirms.              Anus:  normal sphincter tone, no lesions  Chaperone was present for exam.  Assessment:   Well woman visit with normal exam. Status post TAH/LSO. Osteopenia.  Fatigue.  Hx hypothyroidism.  Followed by PCP.   Plan: Yearly mammogram recommended after age 14.  Recommended self breast exam.  Pap and HR HPV as above. Discussed Calcium, Vitamin D, regular exercise program including cardiovascular and weight bearing exercise. Labs performed.  No..   See orders. Refills given on medications.  No..  See orders. Will do bone density at [facility].  Order placed.  Patient will call to schedule. Follow up annually and prn.      After visit summary provided.

## 2015-09-25 DIAGNOSIS — R748 Abnormal levels of other serum enzymes: Secondary | ICD-10-CM

## 2015-09-25 DIAGNOSIS — R7309 Other abnormal glucose: Secondary | ICD-10-CM

## 2015-09-25 DIAGNOSIS — N904 Leukoplakia of vulva: Secondary | ICD-10-CM

## 2015-09-25 DIAGNOSIS — R7989 Other specified abnormal findings of blood chemistry: Secondary | ICD-10-CM

## 2015-09-25 HISTORY — DX: Other specified abnormal findings of blood chemistry: R79.89

## 2015-09-25 HISTORY — DX: Other abnormal glucose: R73.09

## 2015-09-25 HISTORY — DX: Leukoplakia of vulva: N90.4

## 2015-09-25 HISTORY — DX: Abnormal levels of other serum enzymes: R74.8

## 2016-01-24 ENCOUNTER — Other Ambulatory Visit: Payer: Self-pay

## 2016-01-24 DIAGNOSIS — Z1231 Encounter for screening mammogram for malignant neoplasm of breast: Secondary | ICD-10-CM

## 2016-02-02 DIAGNOSIS — N183 Chronic kidney disease, stage 3 (moderate): Secondary | ICD-10-CM | POA: Diagnosis not present

## 2016-02-02 DIAGNOSIS — R739 Hyperglycemia, unspecified: Secondary | ICD-10-CM | POA: Diagnosis not present

## 2016-02-02 DIAGNOSIS — I10 Essential (primary) hypertension: Secondary | ICD-10-CM | POA: Diagnosis not present

## 2016-02-02 DIAGNOSIS — E782 Mixed hyperlipidemia: Secondary | ICD-10-CM | POA: Diagnosis not present

## 2016-02-02 DIAGNOSIS — E7800 Pure hypercholesterolemia, unspecified: Secondary | ICD-10-CM | POA: Diagnosis not present

## 2016-02-02 DIAGNOSIS — K210 Gastro-esophageal reflux disease with esophagitis: Secondary | ICD-10-CM | POA: Diagnosis not present

## 2016-02-08 DIAGNOSIS — R748 Abnormal levels of other serum enzymes: Secondary | ICD-10-CM | POA: Diagnosis not present

## 2016-02-08 DIAGNOSIS — I10 Essential (primary) hypertension: Secondary | ICD-10-CM | POA: Diagnosis not present

## 2016-02-08 DIAGNOSIS — R7301 Impaired fasting glucose: Secondary | ICD-10-CM | POA: Diagnosis not present

## 2016-02-08 DIAGNOSIS — E782 Mixed hyperlipidemia: Secondary | ICD-10-CM | POA: Diagnosis not present

## 2016-02-08 DIAGNOSIS — N183 Chronic kidney disease, stage 3 (moderate): Secondary | ICD-10-CM | POA: Diagnosis not present

## 2016-02-08 DIAGNOSIS — K210 Gastro-esophageal reflux disease with esophagitis: Secondary | ICD-10-CM | POA: Diagnosis not present

## 2016-02-13 DIAGNOSIS — J069 Acute upper respiratory infection, unspecified: Secondary | ICD-10-CM | POA: Diagnosis not present

## 2016-03-08 ENCOUNTER — Ambulatory Visit
Admission: RE | Admit: 2016-03-08 | Discharge: 2016-03-08 | Disposition: A | Discharge status: Home / Self Care | Payer: PPO | Source: Ambulatory Visit

## 2016-03-08 ENCOUNTER — Ambulatory Visit
Admission: RE | Admit: 2016-03-08 | Discharge: 2016-03-08 | Disposition: A | Discharge status: Home / Self Care | Payer: PPO | Source: Ambulatory Visit | Attending: Obstetrics and Gynecology | Admitting: Obstetrics and Gynecology

## 2016-03-08 DIAGNOSIS — M8580 Other specified disorders of bone density and structure, unspecified site: Secondary | ICD-10-CM

## 2016-03-08 DIAGNOSIS — Z1231 Encounter for screening mammogram for malignant neoplasm of breast: Secondary | ICD-10-CM | POA: Diagnosis not present

## 2016-03-08 DIAGNOSIS — Z780 Asymptomatic menopausal state: Secondary | ICD-10-CM | POA: Diagnosis not present

## 2016-03-08 DIAGNOSIS — M8589 Other specified disorders of bone density and structure, multiple sites: Secondary | ICD-10-CM | POA: Diagnosis not present

## 2016-03-09 ENCOUNTER — Telehealth: Payer: Self-pay

## 2016-03-09 NOTE — Telephone Encounter (Signed)
-----   Message from Patton SallesBrook E Amundson C Silva, MD sent at 03/08/2016  9:09 PM EDT ----- Please report BMD results to patient which show osteopenia of the hip and spine.  They actually look improved since her last BMD, but I am not sure if it was with the same machine (location) or not.  Her risk of fracture is considered to be low.  I recommed weight bearing exercise, calcium and vit D daily, and recheck BMD in 2 years.

## 2016-03-09 NOTE — Telephone Encounter (Signed)
Patient notified of results. See lab 

## 2016-03-09 NOTE — Telephone Encounter (Signed)
[id number]

## 2016-03-31 DIAGNOSIS — L03314 Cellulitis of groin: Secondary | ICD-10-CM | POA: Diagnosis not present

## 2016-03-31 DIAGNOSIS — Z6832 Body mass index (BMI) 32.0-32.9, adult: Secondary | ICD-10-CM | POA: Diagnosis not present

## 2016-03-31 DIAGNOSIS — L02214 Cutaneous abscess of groin: Secondary | ICD-10-CM | POA: Diagnosis not present

## 2016-04-03 DIAGNOSIS — Z4800 Encounter for change or removal of nonsurgical wound dressing: Secondary | ICD-10-CM | POA: Diagnosis not present

## 2016-04-03 DIAGNOSIS — L02214 Cutaneous abscess of groin: Secondary | ICD-10-CM | POA: Diagnosis not present

## 2016-05-23 DIAGNOSIS — S161XXA Strain of muscle, fascia and tendon at neck level, initial encounter: Secondary | ICD-10-CM | POA: Diagnosis not present

## 2016-05-23 DIAGNOSIS — M4125 Other idiopathic scoliosis, thoracolumbar region: Secondary | ICD-10-CM | POA: Diagnosis not present

## 2016-05-23 DIAGNOSIS — M546 Pain in thoracic spine: Secondary | ICD-10-CM | POA: Diagnosis not present

## 2016-05-23 DIAGNOSIS — M47816 Spondylosis without myelopathy or radiculopathy, lumbar region: Secondary | ICD-10-CM | POA: Diagnosis not present

## 2016-05-23 DIAGNOSIS — M5136 Other intervertebral disc degeneration, lumbar region: Secondary | ICD-10-CM | POA: Diagnosis not present

## 2016-05-23 DIAGNOSIS — M9902 Segmental and somatic dysfunction of thoracic region: Secondary | ICD-10-CM | POA: Diagnosis not present

## 2016-05-23 DIAGNOSIS — M9903 Segmental and somatic dysfunction of lumbar region: Secondary | ICD-10-CM | POA: Diagnosis not present

## 2016-05-23 DIAGNOSIS — M9901 Segmental and somatic dysfunction of cervical region: Secondary | ICD-10-CM | POA: Diagnosis not present

## 2016-05-25 DIAGNOSIS — M546 Pain in thoracic spine: Secondary | ICD-10-CM | POA: Diagnosis not present

## 2016-05-25 DIAGNOSIS — M9901 Segmental and somatic dysfunction of cervical region: Secondary | ICD-10-CM | POA: Diagnosis not present

## 2016-05-25 DIAGNOSIS — M5136 Other intervertebral disc degeneration, lumbar region: Secondary | ICD-10-CM | POA: Diagnosis not present

## 2016-05-25 DIAGNOSIS — M4125 Other idiopathic scoliosis, thoracolumbar region: Secondary | ICD-10-CM | POA: Diagnosis not present

## 2016-05-25 DIAGNOSIS — S161XXA Strain of muscle, fascia and tendon at neck level, initial encounter: Secondary | ICD-10-CM | POA: Diagnosis not present

## 2016-05-25 DIAGNOSIS — M9903 Segmental and somatic dysfunction of lumbar region: Secondary | ICD-10-CM | POA: Diagnosis not present

## 2016-05-25 DIAGNOSIS — M47816 Spondylosis without myelopathy or radiculopathy, lumbar region: Secondary | ICD-10-CM | POA: Diagnosis not present

## 2016-05-25 DIAGNOSIS — M9902 Segmental and somatic dysfunction of thoracic region: Secondary | ICD-10-CM | POA: Diagnosis not present

## 2016-05-30 DIAGNOSIS — R748 Abnormal levels of other serum enzymes: Secondary | ICD-10-CM | POA: Diagnosis not present

## 2016-05-30 DIAGNOSIS — E039 Hypothyroidism, unspecified: Secondary | ICD-10-CM | POA: Diagnosis not present

## 2016-05-30 DIAGNOSIS — M9901 Segmental and somatic dysfunction of cervical region: Secondary | ICD-10-CM | POA: Diagnosis not present

## 2016-05-30 DIAGNOSIS — M546 Pain in thoracic spine: Secondary | ICD-10-CM | POA: Diagnosis not present

## 2016-05-30 DIAGNOSIS — M47816 Spondylosis without myelopathy or radiculopathy, lumbar region: Secondary | ICD-10-CM | POA: Diagnosis not present

## 2016-05-30 DIAGNOSIS — M9903 Segmental and somatic dysfunction of lumbar region: Secondary | ICD-10-CM | POA: Diagnosis not present

## 2016-05-30 DIAGNOSIS — R739 Hyperglycemia, unspecified: Secondary | ICD-10-CM | POA: Diagnosis not present

## 2016-05-30 DIAGNOSIS — M9902 Segmental and somatic dysfunction of thoracic region: Secondary | ICD-10-CM | POA: Diagnosis not present

## 2016-05-30 DIAGNOSIS — I10 Essential (primary) hypertension: Secondary | ICD-10-CM | POA: Diagnosis not present

## 2016-05-30 DIAGNOSIS — S161XXA Strain of muscle, fascia and tendon at neck level, initial encounter: Secondary | ICD-10-CM | POA: Diagnosis not present

## 2016-05-30 DIAGNOSIS — M5136 Other intervertebral disc degeneration, lumbar region: Secondary | ICD-10-CM | POA: Diagnosis not present

## 2016-05-30 DIAGNOSIS — E7800 Pure hypercholesterolemia, unspecified: Secondary | ICD-10-CM | POA: Diagnosis not present

## 2016-05-30 DIAGNOSIS — K210 Gastro-esophageal reflux disease with esophagitis: Secondary | ICD-10-CM | POA: Diagnosis not present

## 2016-05-30 DIAGNOSIS — E782 Mixed hyperlipidemia: Secondary | ICD-10-CM | POA: Diagnosis not present

## 2016-05-30 DIAGNOSIS — M4125 Other idiopathic scoliosis, thoracolumbar region: Secondary | ICD-10-CM | POA: Diagnosis not present

## 2016-05-30 DIAGNOSIS — N183 Chronic kidney disease, stage 3 (moderate): Secondary | ICD-10-CM | POA: Diagnosis not present

## 2016-06-08 DIAGNOSIS — E039 Hypothyroidism, unspecified: Secondary | ICD-10-CM | POA: Diagnosis not present

## 2016-06-08 DIAGNOSIS — E782 Mixed hyperlipidemia: Secondary | ICD-10-CM | POA: Diagnosis not present

## 2016-06-08 DIAGNOSIS — Z6832 Body mass index (BMI) 32.0-32.9, adult: Secondary | ICD-10-CM | POA: Diagnosis not present

## 2016-06-08 DIAGNOSIS — N183 Chronic kidney disease, stage 3 (moderate): Secondary | ICD-10-CM | POA: Diagnosis not present

## 2016-06-08 DIAGNOSIS — R748 Abnormal levels of other serum enzymes: Secondary | ICD-10-CM | POA: Diagnosis not present

## 2016-06-08 DIAGNOSIS — R7301 Impaired fasting glucose: Secondary | ICD-10-CM | POA: Diagnosis not present

## 2016-06-08 DIAGNOSIS — K210 Gastro-esophageal reflux disease with esophagitis: Secondary | ICD-10-CM | POA: Diagnosis not present

## 2016-06-08 DIAGNOSIS — I10 Essential (primary) hypertension: Secondary | ICD-10-CM | POA: Diagnosis not present

## 2016-06-14 DIAGNOSIS — H524 Presbyopia: Secondary | ICD-10-CM | POA: Diagnosis not present

## 2016-06-14 DIAGNOSIS — H5202 Hypermetropia, left eye: Secondary | ICD-10-CM | POA: Diagnosis not present

## 2016-06-14 DIAGNOSIS — H52223 Regular astigmatism, bilateral: Secondary | ICD-10-CM | POA: Diagnosis not present

## 2016-06-15 ENCOUNTER — Encounter: Payer: Self-pay | Admitting: Obstetrics and Gynecology

## 2016-06-15 ENCOUNTER — Ambulatory Visit (INDEPENDENT_AMBULATORY_CARE_PROVIDER_SITE_OTHER): Payer: PPO | Admitting: Obstetrics and Gynecology

## 2016-06-15 VITALS — BP 112/74 | HR 72 | Resp 16 | Ht 64.0 in | Wt 196.0 lb

## 2016-06-15 DIAGNOSIS — N9089 Other specified noninflammatory disorders of vulva and perineum: Secondary | ICD-10-CM | POA: Diagnosis not present

## 2016-06-15 DIAGNOSIS — R748 Abnormal levels of other serum enzymes: Secondary | ICD-10-CM | POA: Diagnosis not present

## 2016-06-15 DIAGNOSIS — Z01419 Encounter for gynecological examination (general) (routine) without abnormal findings: Secondary | ICD-10-CM

## 2016-06-15 DIAGNOSIS — R7989 Other specified abnormal findings of blood chemistry: Secondary | ICD-10-CM | POA: Diagnosis not present

## 2016-06-15 DIAGNOSIS — K760 Fatty (change of) liver, not elsewhere classified: Secondary | ICD-10-CM | POA: Diagnosis not present

## 2016-06-15 NOTE — Patient Instructions (Signed)

## 2016-06-15 NOTE — Progress Notes (Signed)
65 y.o. 302P2002 Married Caucasian female here for annual exam.    Feeling a little more tired.  On thyroid replacement.  PCP following.   Patient having an abdominal ultrasound today to look at her liver to see if she has fatty liver.   No problem with bladder or bowel function.   Sometimes had itching on her vulva.  Had an I and D of a sebaceous cyst of her leg.   PCP:   Fara ChutePaul Sasser, MD  Patient's last menstrual period was 09/25/1979.           Sexually active: Yes.    The current method of family planning is status post hysterectomy.    Exercising: No.  exercise Smoker:  no  Health Maintenance: Pap:  Yrs ago normal History of abnormal Pap:  no MMG:  03-08-16 category b density birads 1:neg Colonoscopy:  2016 neg f/u 3925yrs BMD:   2017  Result  Osteopenia of hip & spine, stable  T score hip -1.9.  T score spine -1.3.  Looks better overall since 2013. TDaP:  2012 HIV: not done Hep C: not done Screening Labs:  Hb today:Dr.Sasser , Urine today: Dr Neita CarpSasser Self breast exam: done monthly   reports that she has never smoked. She has never used smokeless tobacco. She reports that she does not drink alcohol or use drugs.  Past Medical History:  Diagnosis Date  . Gout of big toe   . Hearing loss of both ears    Right ear hears less well.  . Hyperlipidemia   . Hypertension   . Hypothyroidism    Not on medication.  . Osteoporosis   . Sebaceous cyst    I & D    Past Surgical History:  Procedure Laterality Date  . BARTHOLIN GLAND CYST EXCISION N/A 1985  . dental implants    . TOTAL ABDOMINAL HYSTERECTOMY  1981   left salpingo-oophorectomy and appendectomy - dysfunctional uterine bleeding    Current Outpatient Prescriptions  Medication Sig Dispense Refill  . allopurinol (ZYLOPRIM) 300 MG tablet     . atenolol (TENORMIN) 100 MG tablet Take 1 tablet by mouth 2 (two) times daily.  2  . Calcium Carbonate-Vitamin D (CALCIUM-VITAMIN D) 500-200 MG-UNIT per tablet Take 1  tablet by mouth 2 (two) times daily with a meal.    . cetirizine (ZYRTEC) 10 MG tablet Take 10 mg by mouth daily. Dosage not listed.    . Flaxseed, Linseed, (FLAXSEED OIL PO) Take by mouth daily.    . hydrochlorothiazide (HYDRODIURIL) 25 MG tablet Take 25 mg by mouth daily.    Marland Kitchen. levothyroxine (SYNTHROID, LEVOTHROID) 25 MCG tablet     . Multiple Vitamin (MULTIVITAMIN) tablet Take 1 tablet by mouth daily.    . RESTASIS 0.05 % ophthalmic emulsion     . simvastatin (ZOCOR) 20 MG tablet Take 20 mg by mouth every evening.    . indomethacin (INDOCIN) 50 MG capsule Take 1 capsule by mouth as needed. 1 TABLET prn as needed for Gout flare up.  0   No current facility-administered medications for this visit.     Family History  Problem Relation Age of Onset  . Lymphoma Mother     Non Hodgkins  . Hypertension Mother   . Alzheimer's disease Mother   . Osteoporosis Mother   . ALS Father   . Thrombophlebitis Other     ROS:  Pertinent items are noted in HPI.  Otherwise, a comprehensive ROS was negative.  Exam:  BP 112/74   Pulse 72   Resp 16   Ht 5\' 4"  (1.626 m)   Wt 196 lb (88.9 kg)   LMP 09/25/1979   BMI 33.64 kg/m     General appearance: alert, cooperative and appears stated age Head: Normocephalic, without obvious abnormality, atraumatic Neck: no adenopathy, supple, symmetrical, trachea midline and thyroid normal to inspection and palpation Lungs: clear to auscultation bilaterally Breasts: normal appearance, no masses or tenderness, No nipple retraction or dimpling, No nipple discharge or bleeding, No axillary or supraclavicular adenopathy Heart: regular rate and rhythm Abdomen: soft, non-tender; no masses, no organomegaly Extremities: extremities normal, atraumatic, no cyanosis or edema Skin: Skin color, texture, turgor normal. No rashes or lesions Lymph nodes: Cervical, supraclavicular, and axillary nodes normal. No abnormal inguinal nodes palpated Neurologic: Grossly  normal  Pelvic: External genitalia:  Multiple areas of hypopigmentation of the vulvar.              Urethra:  normal appearing urethra with no masses, tenderness or lesions              Bartholins and Skenes: normal                 Vagina: normal appearing vagina with normal color and discharge, no lesions              Cervix:  absent              Pap taken: No. Bimanual Exam:  Uterus:  absent              Adnexa: no mass, fullness, tenderness              Rectal exam: Yes.  .  Confirms.              Anus:  normal sphincter tone, no lesions  Chaperone was present for exam.  Assessment:   Well woman visit with normal exam. Status post TAH/LSO. Osteopenia.  Hx hypothyroidism.  Followed by PCP.  Vulvar hypopigmentation/lesions.  Plan: Yearly mammogram recommended after age 67.  Recommended self breast exam.  Pap and HR HPV as above. Discussed Calcium, Vitamin D, regular exercise program including cardiovascular and weight bearing exercise. Will get reports of labs from PCP.  Patient will send them in.  Return for vulvar biopsy.  Follow up annually and prn.     After visit summary provided.

## 2016-06-22 ENCOUNTER — Encounter: Payer: Self-pay | Admitting: Obstetrics and Gynecology

## 2016-06-29 ENCOUNTER — Encounter: Payer: Self-pay | Admitting: Obstetrics and Gynecology

## 2016-06-29 ENCOUNTER — Ambulatory Visit (INDEPENDENT_AMBULATORY_CARE_PROVIDER_SITE_OTHER): Payer: PPO | Admitting: Obstetrics and Gynecology

## 2016-06-29 DIAGNOSIS — N9089 Other specified noninflammatory disorders of vulva and perineum: Secondary | ICD-10-CM | POA: Diagnosis not present

## 2016-06-29 DIAGNOSIS — N904 Leukoplakia of vulva: Secondary | ICD-10-CM | POA: Diagnosis not present

## 2016-06-29 NOTE — Patient Instructions (Signed)

## 2016-06-29 NOTE — Progress Notes (Signed)
GYNECOLOGY  VISIT   HPI: 65 y.o.   Married  Caucasian  female   G2P2002 with Patient's last menstrual period was 09/25/1979.   here for   Vulva Bx.  Some itching and burning of the vulva lately.  She states she is not sure if this has been there all along or it just started after I asked her if she had these symptoms at her last visit. No discharge.   Has multiple hypopigmented areas of the vulva.  No prior biopsy.  GYNECOLOGIC HISTORY: Patient's last menstrual period was 09/25/1979. Contraception:  Post Hysterectomy Menopausal hormone therapy:  none Last mammogram:  03/08/16 BIRADS1 negative Last pap smear:   Years ago: normal        OB History    Gravida Para Term Preterm AB Living   2 2 2  0 0 2   SAB TAB Ectopic Multiple Live Births   0 0 0 0           Patient Active Problem List   Diagnosis Date Noted  . S/P abdominal hysterectomy and left salpingo-oophorectomy, appy for DUB 1981 12/24/2012  . HTN (hypertension) 12/24/2012  . Pure hypercholesterolemia 12/24/2012  . Hearing loss of both ears, rt worse than lft 12/24/2012    Past Medical History:  Diagnosis Date  . Elevated hemoglobin A1c 2017   5.9  . Elevated liver enzymes 2017   AST - 70, ALT - 65  . Elevated serum creatinine 2017   1.05  . Gout of big toe   . Hearing loss of both ears    Right ear hears less well.  . Hyperlipidemia   . Hypertension   . Hypothyroidism    Not on medication.  . Osteoporosis   . Sebaceous cyst    I & D    Past Surgical History:  Procedure Laterality Date  . BARTHOLIN GLAND CYST EXCISION N/A 1985  . dental implants    . TOTAL ABDOMINAL HYSTERECTOMY  1981   left salpingo-oophorectomy and appendectomy - dysfunctional uterine bleeding    Current Outpatient Prescriptions  Medication Sig Dispense Refill  . Alfalfa 500 MG TABS Take by mouth.    Marland Kitchen allopurinol (ZYLOPRIM) 300 MG tablet     . atenolol (TENORMIN) 100 MG tablet Take 1 tablet by mouth 2 (two) times daily.  2   . Calcium Carbonate-Vitamin D (CALCIUM-VITAMIN D) 500-200 MG-UNIT per tablet Take 1 tablet by mouth 2 (two) times daily with a meal.    . cetirizine (ZYRTEC) 10 MG tablet Take 10 mg by mouth daily. Dosage not listed.    . Flaxseed, Linseed, (FLAXSEED OIL PO) Take by mouth daily.    . hydrochlorothiazide (HYDRODIURIL) 25 MG tablet Take 25 mg by mouth daily.    . indomethacin (INDOCIN) 50 MG capsule Take 1 capsule by mouth as needed. 1 TABLET prn as needed for Gout flare up.  0  . levothyroxine (SYNTHROID, LEVOTHROID) 25 MCG tablet     . Multiple Vitamin (MULTIVITAMIN) tablet Take 1 tablet by mouth daily.    . RESTASIS 0.05 % ophthalmic emulsion     . simvastatin (ZOCOR) 20 MG tablet Take 20 mg by mouth every evening.     No current facility-administered medications for this visit.      ALLERGIES: Review of patient's allergies indicates no known allergies.  Family History  Problem Relation Age of Onset  . Lymphoma Mother     Non Hodgkins  . Hypertension Mother   . Alzheimer's disease Mother   .  Osteoporosis Mother   . ALS Father   . Thrombophlebitis Other     Social History   Social History  . Marital status: Married    Spouse name: N/A  . Number of children: 2  . Years of education: N/A   Occupational History  .  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 Smoking status: Never Smoker  . Smokeless tobacco: Never Used  . Alcohol use No  . Drug use: No  . Sexual activity: Yes    Partners: Male    Birth control/ protection: Post-menopausal, Surgical     Comment: TAH/LSO   Other Topics Concern  . Not on file   Social History Narrative  . No narrative on file    ROS:  Pertinent items are noted in HPI.  PHYSICAL EXAMINATION:    BP 128/76 (BP Location: Right Arm, Patient Position: Sitting, Cuff Size: Normal)   Pulse 72   Resp 18   Ht 5\' 4"  (1.626 m)   Wt 198 lb (89.8 kg)   LMP 09/25/1979   BMI 33.99 kg/m     General appearance: alert, cooperative  and appears stated age    Pelvic: External genitalia:  Multiple hypopigmented areas of the vulva - bilateral labia majora, perineum.  Salt and pepper pattern.  Not typical for vitiligo.               Urethra:  normal appearing urethra with no masses, tenderness or lesions           Vulvar biopsy.  Consent for procedure.  Sterile prep of right labia majora with betadine.  1% lidocaine - lot number [id number], exp 10/2019 4 mm punch biopsy to hypopigmented region of the right labia majora.  Tissue to GPA.  AgNO3 used.  Single suture of 3/0 vicryl.  Good hemostasis.  Bandaid placed.  No complications.  Chaperone was present for exam.  ASSESSMENT  Vulval hypopigmentation/lesions.  ?Etiology?  Some pruritis/burning.  PLAN  Discussion of potential etiologies - lichen sclerosus, dysplasia, malignancy.  Care of biopsy site reviewed. Final plan to follow after result is back.     An After Visit Summary was printed and given to the patient.

## 2016-07-02 ENCOUNTER — Encounter: Payer: Self-pay | Admitting: Obstetrics and Gynecology

## 2016-07-06 ENCOUNTER — Encounter: Payer: Self-pay | Admitting: Obstetrics and Gynecology

## 2016-07-06 ENCOUNTER — Ambulatory Visit (INDEPENDENT_AMBULATORY_CARE_PROVIDER_SITE_OTHER): Payer: PPO | Admitting: Obstetrics and Gynecology

## 2016-07-06 VITALS — BP 102/70 | HR 76 | Resp 16 | Ht 64.0 in | Wt 198.0 lb

## 2016-07-06 DIAGNOSIS — L900 Lichen sclerosus et atrophicus: Secondary | ICD-10-CM | POA: Diagnosis not present

## 2016-07-06 MED ORDER — CLOBETASOL PROPIONATE 0.05 % EX OINT: 1.0000 "application " | TOPICAL_OINTMENT | Freq: Two times a day (BID) | CUTANEOUS | 0 refills | Status: DC

## 2016-07-06 NOTE — Progress Notes (Signed)
Patient ID: Kristin Houston, female   DOB: Mar 29, 1951, 65 y.o.   MRN: [id number]  . GYNECOLOGY  VISIT   HPI: 65 y.o.   Married  Caucasian  female   G2P2002 with Patient's last menstrual period was 09/25/1979.   Here to discuss treatment options for lichen sclerosis noted on recent vulvar biopsy.  Biopsy was done due to multiple areas of hypopigmentation of the vulva.   Has burning and itching but not all the time.  Just dismissed this in the past.   GYNECOLOGIC HISTORY: Patient's last menstrual period was 09/25/1979. Contraception:  Status post hysterectomy Last mammogram:  03/08/16, Density Category B: Bi-Rads 1: Negative Last pap smear:   09/24/00, Negative         OB History    Gravida Para Term Preterm AB Living   2 2 2  0 0 2   SAB TAB Ectopic Multiple Live Births   0 0 0 0 2         Patient Active Problem List   Diagnosis Date Noted  . S/P abdominal hysterectomy and left salpingo-oophorectomy, appy for DUB 1981 12/24/2012  . HTN (hypertension) 12/24/2012  . Pure hypercholesterolemia 12/24/2012  . Hearing loss of both ears, rt worse than lft 12/24/2012    Past Medical History:  Diagnosis Date  . Elevated hemoglobin A1c 2017   5.9  . Elevated liver enzymes 2017   AST - 70, ALT - 65  . Elevated serum creatinine 2017   1.05  . Gout of big toe   . Hearing loss of both ears    Right ear hears less well.  . Hyperlipidemia   . Hypertension   . Hypothyroidism    Not on medication.  . Lichen sclerosus et atrophicus of the vulva 2017  . Osteoporosis   . Sebaceous cyst    I & D    Past Surgical History:  Procedure Laterality Date  . BARTHOLIN GLAND CYST EXCISION N/A 1985  . dental implants    . TOTAL ABDOMINAL HYSTERECTOMY  1981   left salpingo-oophorectomy and appendectomy - dysfunctional uterine bleeding    Current Outpatient Prescriptions  Medication Sig Dispense Refill  . Alfalfa 500 MG TABS Take by mouth.    Marland Kitchen. allopurinol (ZYLOPRIM) 300 MG tablet     .  atenolol (TENORMIN) 100 MG tablet Take 1 tablet by mouth 2 (two) times daily.  2  . Calcium Carbonate-Vitamin D (CALCIUM-VITAMIN D) 500-200 MG-UNIT per tablet Take 1 tablet by mouth 2 (two) times daily with a meal.    . cetirizine (ZYRTEC) 10 MG tablet Take 10 mg by mouth daily. Dosage not listed.    . Flaxseed, Linseed, (FLAXSEED OIL PO) Take by mouth daily.    . hydrochlorothiazide (HYDRODIURIL) 25 MG tablet Take 25 mg by mouth daily.    . indomethacin (INDOCIN) 50 MG capsule Take 1 capsule by mouth as needed. 1 TABLET prn as needed for Gout flare up.  0  . levothyroxine (SYNTHROID, LEVOTHROID) 25 MCG tablet     . Multiple Vitamin (MULTIVITAMIN) tablet Take 1 tablet by mouth daily.    . RESTASIS 0.05 % ophthalmic emulsion     . simvastatin (ZOCOR) 20 MG tablet Take 20 mg by mouth every evening.     No current facility-administered medications for this visit.      ALLERGIES: Review of patient's allergies indicates no known allergies.  Family History  Problem Relation Age of Onset  . Lymphoma Mother     Non Hodgkins  .  Hypertension Mother   . Alzheimer's disease Mother   . Osteoporosis Mother   . ALS Father   . Thrombophlebitis Other     Social History   Social History  . Marital status: Married    Spouse name: N/A  . Number of children: 2  . Years of education: N/A   Occupational History  .  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 Smoking status: Never Smoker  . Smokeless tobacco: Never Used  . Alcohol use No  . Drug use: No  . Sexual activity: Yes    Partners: Male    Birth control/ protection: Post-menopausal, Surgical     Comment: TAH/LSO   Other Topics Concern  . Not on file   Social History Narrative  . No narrative on file    ROS:  Pertinent items are noted in HPI.  PHYSICAL EXAMINATION:    LMP 09/25/1979     General appearance: alert, cooperative and appears stated age     Vulva with biopsy site with mucousy drainage.  Suture still  present.  Multiple areas of hypopigmentation noted.  Fusion of labia majora and minora.   ASSESSMENT  Lichen sclerosus.   PLAN  Lichen sclerosus discussed. Clobetasol ointment 0.05% to area bid for one month, then twice a day for a week for flares.  Instructed in proper use with a mirror. Follow up for a recheck in 4 weeks.    An After Visit Summary was printed and given to the patient.  ___15___ minutes face to face time of which over 50% was spent in counseling.

## 2016-07-06 NOTE — Patient Instructions (Signed)
Lichen Sclerosus Lichen sclerosus is a skin problem. It can happen on any part of the body, but it commonly involves the anal or genital areas. It can cause itching and discomfort in these areas. Treatment can help to control symptoms. When the genital area is affected, getting treatment is important because the condition can cause scarring that may lead to other problems. CAUSES The cause of this condition is not known. It could be the result of an overactive immune system or a lack of certain hormones. Lichen sclerosus is not an infection or a fungus. It is not passed from one person to another (not contagious). RISK FACTORS This condition is more likely to develop in women, usually after menopause. SYMPTOMS Symptoms of this condition include:  Thin, wrinkled, white areas on the skin.  Thickened white areas on the skin.  Red and swollen patches (lesions) on the skin.  Tears or cracks in the skin.  Bruising.  Blood blisters.  Severe itching. You may also have pain, itching, or burning with urination. Constipation is also common in people with lichen sclerosus. DIAGNOSIS This condition may be diagnosed with a physical exam. In some cases, a tissue sample (biopsy sample) may be removed to be looked at under a microscope. TREATMENT This condition is usually treated with medicated creams or ointments (topical steroids) that are applied over the affected areas. HOME CARE INSTRUCTIONS  Take over-the-counter and prescription medicines only as told by your health care provider.  Use creams or ointments as told by your health care provider.  Do not scratch the affected areas of skin.  Women should keep the vaginal area as clean and dry as possible.  Keep all follow-up visits as told by your health care provider. This is important. SEEK MEDICAL CARE IF:  You have increasing redness, swelling, or pain in the affected area.  You have fluid, blood, or pus coming from the affected  area.  You have new lesions on your skin.  You have pain or burning with urination.  You have pain during sex.   This information is not intended to replace advice given to you by your health care provider. Make sure you discuss any questions you have with your health care provider.   Document Released: 01/31/2011 Document Revised: 06/01/2015 Document Reviewed: 12/06/2014 Elsevier Interactive Patient Education 2016 [employer]  

## 2016-08-02 DIAGNOSIS — R05 Cough: Secondary | ICD-10-CM | POA: Diagnosis not present

## 2016-08-02 DIAGNOSIS — Z6832 Body mass index (BMI) 32.0-32.9, adult: Secondary | ICD-10-CM | POA: Diagnosis not present

## 2016-08-02 DIAGNOSIS — J209 Acute bronchitis, unspecified: Secondary | ICD-10-CM | POA: Diagnosis not present

## 2016-08-03 ENCOUNTER — Ambulatory Visit: Payer: PPO | Admitting: Obstetrics and Gynecology

## 2016-08-03 ENCOUNTER — Encounter: Payer: Self-pay | Admitting: Obstetrics and Gynecology

## 2016-08-03 VITALS — BP 122/80 | HR 60 | Ht 64.0 in | Wt 195.6 lb

## 2016-08-03 DIAGNOSIS — L900 Lichen sclerosus et atrophicus: Secondary | ICD-10-CM

## 2016-08-03 NOTE — Progress Notes (Signed)
GYNECOLOGY  VISIT   HPI: 65 y.o.   Married  Caucasian  female   G2P2002 with Patient's last menstrual period was 09/25/1979.   here for 4 week follow up on Lichen sclerosus.    Using the clobetsol ointment and having less itching now. Using twice day for the last month.   States she did not know there was really a problem until I pointed out the pigmentation change.  Then she realized she was having itching.   States her sister also has lichen sclerosus.  Has bronchitis.  GYNECOLOGIC HISTORY: Patient's last menstrual period was 09/25/1979. Contraception: Hysterectomy   Menopausal hormone therapy:  none Last mammogram:   03/08/16, Density Category B: Bi-Rads 1: Negative  Last pap smear:   09-24-00 Negative        OB History    Gravida Para Term Preterm AB Living   2 2 2  0 0 2   SAB TAB Ectopic Multiple Live Births   0 0 0 0 2         Patient Active Problem List   Diagnosis Date Noted  . S/P abdominal hysterectomy and left salpingo-oophorectomy, appy for DUB 1981 12/24/2012  . HTN (hypertension) 12/24/2012  . Pure hypercholesterolemia 12/24/2012  . Hearing loss of both ears, rt worse than lft 12/24/2012    Past Medical History:  Diagnosis Date  . Elevated hemoglobin A1c 2017   5.9  . Elevated liver enzymes 2017   AST - 70, ALT - 65  . Elevated serum creatinine 2017   1.05  . Gout of big toe   . Hearing loss of both ears    Right ear hears less well.  . Hyperlipidemia   . Hypertension   . Hypothyroidism    Not on medication.  . Lichen sclerosus et atrophicus of the vulva 2017  . Osteoporosis   . Sebaceous cyst    I & D    Past Surgical History:  Procedure Laterality Date  . BARTHOLIN GLAND CYST EXCISION N/A 1985  . dental implants    . TOTAL ABDOMINAL HYSTERECTOMY  1981   left salpingo-oophorectomy and appendectomy - dysfunctional uterine bleeding    Current Outpatient Prescriptions  Medication Sig Dispense Refill  . Alfalfa 500 MG TABS Take by mouth.     Marland Kitchen. allopurinol (ZYLOPRIM) 300 MG tablet     . atenolol (TENORMIN) 100 MG tablet Take 1 tablet by mouth 2 (two) times daily.  2  . Calcium Carbonate-Vitamin D (CALCIUM-VITAMIN D) 500-200 MG-UNIT per tablet Take 1 tablet by mouth 2 (two) times daily with a meal.    . cetirizine (ZYRTEC) 10 MG tablet Take 10 mg by mouth daily. Dosage not listed.    . clobetasol ointment (TEMOVATE) 0.05 % Apply 1 application topically 2 (two) times daily. Use for one week for flares of lichen sclerosus. 60 g 0  . Flaxseed, Linseed, (FLAXSEED OIL PO) Take by mouth daily.    . hydrochlorothiazide (HYDRODIURIL) 25 MG tablet Take 25 mg by mouth daily.    . indomethacin (INDOCIN) 50 MG capsule Take 1 capsule by mouth as needed. 1 TABLET prn as needed for Gout flare up.  0  . levothyroxine (SYNTHROID, LEVOTHROID) 25 MCG tablet     . Multiple Vitamin (MULTIVITAMIN) tablet Take 1 tablet by mouth daily.    . RESTASIS 0.05 % ophthalmic emulsion     . simvastatin (ZOCOR) 20 MG tablet Take 20 mg by mouth every evening.     No current facility-administered medications  for this visit.      ALLERGIES: Patient has no known allergies.  Family History  Problem Relation Age of Onset  . Lymphoma Mother     Non Hodgkins  . Hypertension Mother   . Alzheimer's disease Mother   . Osteoporosis Mother   . ALS Father   . Thrombophlebitis Other     Social History   Social History  . Marital status: Married    Spouse name: N/A  . Number of children: 2  . Years of education: N/A   Occupational History  .  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 Smoking status: Never Smoker  . Smokeless tobacco: Never Used  . Alcohol use No  . Drug use: No  . Sexual activity: Yes    Partners: Male    Birth control/ protection: Post-menopausal, Surgical     Comment: TAH/LSO   Other Topics Concern  . Not on file   Social History Narrative  . No narrative on file    ROS:  Pertinent items are noted in  HPI.  PHYSICAL EXAMINATION:    BP 122/80 (BP Location: Right Arm, Patient Position: Sitting, Cuff Size: Normal)   Pulse 60   Ht 5\' 4"  (1.626 m)   Wt 195 lb 9.6 oz (88.7 kg)   LMP 09/25/1979   BMI 33.57 kg/m     General appearance: alert, cooperative and appears stated age.  Coughing.     Pelvic: External genitalia:  Biopsy site healing on right labia majora.  Hypopigmentation change present scattered throughout vulva.              Urethra:  normal appearing urethra with no masses, tenderness or lesions                Chaperone was present for exam.  ASSESSMENT  Lichen sclerosus improved with steroid ointment.   PLAN  Reduce clobetasol to once a day, only 2 days per week.  I told her she could use once to twice weekly to see if symptoms remain controlled.  If has increase in itching, use twice a day for 1 week and then decrease again.  We discussed adrenal suppression if uses chronically twice a day.  Follow up prn.    An After Visit Summary was printed and given to the patient.  __15____ minutes face to face time of which over 50% was spent in counseling.

## 2016-09-04 DIAGNOSIS — Z205 Contact with and (suspected) exposure to viral hepatitis: Secondary | ICD-10-CM | POA: Diagnosis not present

## 2016-09-04 DIAGNOSIS — E039 Hypothyroidism, unspecified: Secondary | ICD-10-CM | POA: Diagnosis not present

## 2016-09-06 DIAGNOSIS — Z6832 Body mass index (BMI) 32.0-32.9, adult: Secondary | ICD-10-CM | POA: Diagnosis not present

## 2016-09-06 DIAGNOSIS — R7301 Impaired fasting glucose: Secondary | ICD-10-CM | POA: Diagnosis not present

## 2016-09-06 DIAGNOSIS — R748 Abnormal levels of other serum enzymes: Secondary | ICD-10-CM | POA: Diagnosis not present

## 2016-09-06 DIAGNOSIS — K210 Gastro-esophageal reflux disease with esophagitis: Secondary | ICD-10-CM | POA: Diagnosis not present

## 2016-09-06 DIAGNOSIS — I10 Essential (primary) hypertension: Secondary | ICD-10-CM | POA: Diagnosis not present

## 2016-09-06 DIAGNOSIS — E782 Mixed hyperlipidemia: Secondary | ICD-10-CM | POA: Diagnosis not present

## 2016-09-06 DIAGNOSIS — N183 Chronic kidney disease, stage 3 (moderate): Secondary | ICD-10-CM | POA: Diagnosis not present

## 2016-09-06 DIAGNOSIS — Z1389 Encounter for screening for other disorder: Secondary | ICD-10-CM | POA: Diagnosis not present

## 2016-10-29 DIAGNOSIS — Z6833 Body mass index (BMI) 33.0-33.9, adult: Secondary | ICD-10-CM | POA: Diagnosis not present

## 2016-10-29 DIAGNOSIS — J209 Acute bronchitis, unspecified: Secondary | ICD-10-CM | POA: Diagnosis not present

## 2016-12-17 DIAGNOSIS — E782 Mixed hyperlipidemia: Secondary | ICD-10-CM | POA: Diagnosis not present

## 2016-12-17 DIAGNOSIS — I10 Essential (primary) hypertension: Secondary | ICD-10-CM | POA: Diagnosis not present

## 2016-12-17 DIAGNOSIS — E039 Hypothyroidism, unspecified: Secondary | ICD-10-CM | POA: Diagnosis not present

## 2016-12-17 DIAGNOSIS — E7800 Pure hypercholesterolemia, unspecified: Secondary | ICD-10-CM | POA: Diagnosis not present

## 2016-12-27 DIAGNOSIS — E782 Mixed hyperlipidemia: Secondary | ICD-10-CM | POA: Diagnosis not present

## 2016-12-27 DIAGNOSIS — K210 Gastro-esophageal reflux disease with esophagitis: Secondary | ICD-10-CM | POA: Diagnosis not present

## 2016-12-27 DIAGNOSIS — R748 Abnormal levels of other serum enzymes: Secondary | ICD-10-CM | POA: Diagnosis not present

## 2016-12-27 DIAGNOSIS — N183 Chronic kidney disease, stage 3 (moderate): Secondary | ICD-10-CM | POA: Diagnosis not present

## 2016-12-27 DIAGNOSIS — E039 Hypothyroidism, unspecified: Secondary | ICD-10-CM | POA: Diagnosis not present

## 2016-12-27 DIAGNOSIS — M109 Gout, unspecified: Secondary | ICD-10-CM | POA: Diagnosis not present

## 2016-12-27 DIAGNOSIS — I10 Essential (primary) hypertension: Secondary | ICD-10-CM | POA: Diagnosis not present

## 2016-12-27 DIAGNOSIS — R7301 Impaired fasting glucose: Secondary | ICD-10-CM | POA: Diagnosis not present

## 2017-02-05 ENCOUNTER — Other Ambulatory Visit: Payer: Self-pay | Admitting: Obstetrics and Gynecology

## 2017-02-05 DIAGNOSIS — Z1231 Encounter for screening mammogram for malignant neoplasm of breast: Secondary | ICD-10-CM

## 2017-03-13 ENCOUNTER — Ambulatory Visit
Admission: RE | Admit: 2017-03-13 | Discharge: 2017-03-13 | Disposition: A | Discharge status: Home / Self Care | Payer: PPO | Source: Ambulatory Visit | Attending: Obstetrics and Gynecology | Admitting: Obstetrics and Gynecology

## 2017-03-13 DIAGNOSIS — Z1231 Encounter for screening mammogram for malignant neoplasm of breast: Secondary | ICD-10-CM

## 2017-03-21 ENCOUNTER — Ambulatory Visit: Payer: PPO

## 2017-06-20 NOTE — Progress Notes (Signed)
66 y.o. G27P2002 Married Caucasian female here for annual exam.    Using the Clobetasol sporadically.  Uses it twice a day for 1 - 2 weeks as needed.  PCP does blood work.   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   ROS negative.  PCP: Fara Chute, MD    Patient's last menstrual period was 09/25/1979.           Sexually active: Yes.   female The current method of family planning is status post hysterectomy.    Exercising: Yes.    walking Smoker:  no  Health Maintenance: Pap: 2002 Neg History of abnormal Pap:  no MMG:  03-13-17 Density A/Neg/BiRads1:TBC Colonoscopy: 2016 normal with Dr.Medoff;next due 2026 BMD: 03-08-16  Result: Osteopenia of hip & spine, stable T score hip -1.9.  T score spine -1.3.  Looks better overall since 2013. TDaP:  2012 Gardasil:   no HIV:no Hep C: 09-05-16 Neg Screening Labs:  Hb today: PCP, Urine today: not done   reports that she has never smoked. She has never used smokeless tobacco. She reports that she does not drink alcohol or use drugs.  Past Medical History:  Diagnosis Date  . Elevated hemoglobin A1c 2017   5.9  . Elevated liver enzymes 2017   AST - 70, ALT - 65  . Elevated serum creatinine 2017   1.05  . Gout of big toe   . Hearing loss of both ears    Right ear hears less well.  . Hyperlipidemia   . Hypertension   . Hypothyroidism    Not on medication.  . Lichen sclerosus et atrophicus of the vulva 2017  . Osteoporosis   . Sebaceous cyst    I & D    Past Surgical History:  Procedure Laterality Date  . BARTHOLIN GLAND CYST EXCISION N/A 1985  . dental implants    . TOTAL ABDOMINAL HYSTERECTOMY  1981   left salpingo-oophorectomy and appendectomy - dysfunctional uterine bleeding    Current Outpatient Prescriptions  Medication Sig Dispense Refill  . Alfalfa 500 MG TABS Take by mouth.    Marland Kitchen allopurinol (ZYLOPRIM) 300 MG tablet     . atenolol (TENORMIN) 100 MG tablet Take 1 tablet by mouth 2  (two) times daily.  2  . Calcium Carbonate-Vitamin D (CALCIUM-VITAMIN D) 500-200 MG-UNIT per tablet Take 1 tablet by mouth 2 (two) times daily with a meal.    . cetirizine (ZYRTEC) 10 MG tablet Take 10 mg by mouth daily. Dosage not listed.    . clobetasol ointment (TEMOVATE) 0.05 % Apply 1 application topically 2 (two) times daily. Use for one week for flares of lichen sclerosus. 60 g 0  . Flaxseed, Linseed, (FLAXSEED OIL PO) Take by mouth daily.    . hydrochlorothiazide (HYDRODIURIL) 25 MG tablet Take 25 mg by mouth daily.    . indomethacin (INDOCIN) 50 MG capsule Take 1 capsule by mouth as needed. 1 TABLET prn as needed for Gout flare up.  0  . levothyroxine (SYNTHROID, LEVOTHROID) 50 MCG tablet Take 50 mcg by mouth daily.  3  . Multiple Vitamin (MULTIVITAMIN) tablet Take 1 tablet by mouth daily.    . RESTASIS 0.05 % ophthalmic emulsion     . simvastatin (ZOCOR) 20 MG tablet Take 20 mg by mouth every evening.     No current facility-administered medications for this visit.     Family History  Problem Relation Age of Onset  . Lymphoma  Mother        Non Hodgkins  . Hypertension Mother   . Alzheimer's disease Mother   . Osteoporosis Mother   . ALS Father   . Thrombophlebitis Other     ROS:  Pertinent items are noted in HPI.  Otherwise, a comprehensive ROS was negative.  Exam:   BP 136/80 (BP Location: Right Arm, Patient Position: Sitting, Cuff Size: Normal)   Pulse 60   Resp 16   Ht  (1.626 m)   Wt 200 lb (90.7 kg)   LMP 09/25/1979   BMI 34.33 kg/m     General appearance: alert, cooperative and appears stated age Head: Normocephalic, without obvious abnormality, atraumatic Neck: no adenopathy, supple, symmetrical, trachea midline and thyroid normal to inspection and palpation Lungs: clear to auscultation bilaterally Breasts: normal appearance, no masses or tenderness, No nipple retraction or dimpling, No nipple discharge or bleeding, No axillary or supraclavicular  adenopathy Heart: regular rate and rhythm Abdomen: soft, non-tender; no masses, no organomegaly Extremities: extremities normal, atraumatic, no cyanosis or edema Skin: Skin color, texture, turgor normal. No rashes or lesions Lymph nodes: Cervical, supraclavicular, and axillary nodes normal. No abnormal inguinal nodes palpated Neurologic: Grossly normal  Pelvic: External genitalia:   Inferior right labia majora with 4 mm sebaceous cyst with mild erythema.  Mildly tender.              Urethra:  normal appearing urethra with no masses, tenderness or lesions              Bartholins and Skenes: normal                 Vagina: normal appearing vagina with normal color and discharge, no lesions              Cervix: absent.               Pap taken: No. Bimanual Exam:  Uterus:   Absent.               Adnexa: no mass, fullness, tenderness              Rectal exam: Yes.  .  Confirms.              Anus:  normal sphincter tone, no lesions  Chaperone was present for exam.  Assessment:   Well woman visit with normal exam. Status post TAH/LSO/appy. Osteopenia.  Hx hypothyroidism. Followed by PCP.  Lichen sclerosus.   Plan: Mammogram screening discussed. Recommended self breast awareness. Pap and HR HPV as above. Guidelines for Calcium, Vitamin D, regular exercise program including cardiovascular and weight bearing exercise. BMD next year.  Labs with PCP.  Follow up annually and prn.   After visit summary provided.

## 2017-06-21 ENCOUNTER — Ambulatory Visit (INDEPENDENT_AMBULATORY_CARE_PROVIDER_SITE_OTHER): Payer: PPO | Admitting: Obstetrics and Gynecology

## 2017-06-21 ENCOUNTER — Encounter: Payer: Self-pay | Admitting: Obstetrics and Gynecology

## 2017-06-21 VITALS — BP 136/80 | HR 60 | Resp 16 | Ht 64.0 in | Wt 200.0 lb

## 2017-06-21 DIAGNOSIS — Z01419 Encounter for gynecological examination (general) (routine) without abnormal findings: Secondary | ICD-10-CM

## 2017-06-21 NOTE — Patient Instructions (Signed)

## 2017-06-25 DIAGNOSIS — R748 Abnormal levels of other serum enzymes: Secondary | ICD-10-CM | POA: Diagnosis not present

## 2017-06-25 DIAGNOSIS — R739 Hyperglycemia, unspecified: Secondary | ICD-10-CM | POA: Diagnosis not present

## 2017-06-25 DIAGNOSIS — E7800 Pure hypercholesterolemia, unspecified: Secondary | ICD-10-CM | POA: Diagnosis not present

## 2017-06-25 DIAGNOSIS — I10 Essential (primary) hypertension: Secondary | ICD-10-CM | POA: Diagnosis not present

## 2017-06-25 DIAGNOSIS — K210 Gastro-esophageal reflux disease with esophagitis: Secondary | ICD-10-CM | POA: Diagnosis not present

## 2017-06-25 DIAGNOSIS — N183 Chronic kidney disease, stage 3 (moderate): Secondary | ICD-10-CM | POA: Diagnosis not present

## 2017-06-25 DIAGNOSIS — E039 Hypothyroidism, unspecified: Secondary | ICD-10-CM | POA: Diagnosis not present

## 2017-06-25 DIAGNOSIS — M109 Gout, unspecified: Secondary | ICD-10-CM | POA: Diagnosis not present

## 2017-06-25 DIAGNOSIS — E782 Mixed hyperlipidemia: Secondary | ICD-10-CM | POA: Diagnosis not present

## 2017-06-27 DIAGNOSIS — M109 Gout, unspecified: Secondary | ICD-10-CM | POA: Diagnosis not present

## 2017-06-27 DIAGNOSIS — R7301 Impaired fasting glucose: Secondary | ICD-10-CM | POA: Diagnosis not present

## 2017-06-27 DIAGNOSIS — R748 Abnormal levels of other serum enzymes: Secondary | ICD-10-CM | POA: Diagnosis not present

## 2017-06-27 DIAGNOSIS — Z6832 Body mass index (BMI) 32.0-32.9, adult: Secondary | ICD-10-CM | POA: Diagnosis not present

## 2017-06-27 DIAGNOSIS — I10 Essential (primary) hypertension: Secondary | ICD-10-CM | POA: Diagnosis not present

## 2017-06-27 DIAGNOSIS — E782 Mixed hyperlipidemia: Secondary | ICD-10-CM | POA: Diagnosis not present

## 2017-06-27 DIAGNOSIS — E039 Hypothyroidism, unspecified: Secondary | ICD-10-CM | POA: Diagnosis not present

## 2017-06-27 DIAGNOSIS — N183 Chronic kidney disease, stage 3 (moderate): Secondary | ICD-10-CM | POA: Diagnosis not present

## 2017-09-26 DIAGNOSIS — Z6830 Body mass index (BMI) 30.0-30.9, adult: Secondary | ICD-10-CM | POA: Diagnosis not present

## 2017-09-26 DIAGNOSIS — M25532 Pain in left wrist: Secondary | ICD-10-CM | POA: Diagnosis not present

## 2017-11-27 ENCOUNTER — Telehealth: Payer: Self-pay | Admitting: Obstetrics and Gynecology

## 2017-11-27 ENCOUNTER — Other Ambulatory Visit: Payer: Self-pay | Admitting: Obstetrics and Gynecology

## 2017-11-27 DIAGNOSIS — Z1231 Encounter for screening mammogram for malignant neoplasm of breast: Secondary | ICD-10-CM

## 2017-11-27 DIAGNOSIS — M8589 Other specified disorders of bone density and structure, multiple sites: Secondary | ICD-10-CM

## 2017-11-27 NOTE — Telephone Encounter (Signed)
Patient called to see if she is due for her bone density test yet. She has a mamm[facility]

## 2017-11-27 NOTE — Telephone Encounter (Signed)
Last BMD was 03/08/2016 due for next BMD after 03/08/2018. [facility]. Spoke with patient who will contact the [facility] to schedule BMD with screening mammogram.  Routing to provider for final review. Patient agreeable to disposition. Will close encounter.

## 2017-12-24 DIAGNOSIS — K210 Gastro-esophageal reflux disease with esophagitis: Secondary | ICD-10-CM | POA: Diagnosis not present

## 2017-12-24 DIAGNOSIS — E782 Mixed hyperlipidemia: Secondary | ICD-10-CM | POA: Diagnosis not present

## 2017-12-24 DIAGNOSIS — E039 Hypothyroidism, unspecified: Secondary | ICD-10-CM | POA: Diagnosis not present

## 2017-12-24 DIAGNOSIS — R739 Hyperglycemia, unspecified: Secondary | ICD-10-CM | POA: Diagnosis not present

## 2017-12-24 DIAGNOSIS — I10 Essential (primary) hypertension: Secondary | ICD-10-CM | POA: Diagnosis not present

## 2017-12-24 DIAGNOSIS — M109 Gout, unspecified: Secondary | ICD-10-CM | POA: Diagnosis not present

## 2017-12-24 DIAGNOSIS — R748 Abnormal levels of other serum enzymes: Secondary | ICD-10-CM | POA: Diagnosis not present

## 2017-12-24 DIAGNOSIS — E7800 Pure hypercholesterolemia, unspecified: Secondary | ICD-10-CM | POA: Diagnosis not present

## 2017-12-26 DIAGNOSIS — I10 Essential (primary) hypertension: Secondary | ICD-10-CM | POA: Diagnosis not present

## 2017-12-26 DIAGNOSIS — K210 Gastro-esophageal reflux disease with esophagitis: Secondary | ICD-10-CM | POA: Diagnosis not present

## 2017-12-26 DIAGNOSIS — Z1389 Encounter for screening for other disorder: Secondary | ICD-10-CM | POA: Diagnosis not present

## 2017-12-26 DIAGNOSIS — Z0001 Encounter for general adult medical examination with abnormal findings: Secondary | ICD-10-CM | POA: Diagnosis not present

## 2017-12-26 DIAGNOSIS — E039 Hypothyroidism, unspecified: Secondary | ICD-10-CM | POA: Diagnosis not present

## 2017-12-26 DIAGNOSIS — E6609 Other obesity due to excess calories: Secondary | ICD-10-CM | POA: Diagnosis not present

## 2017-12-26 DIAGNOSIS — Z6830 Body mass index (BMI) 30.0-30.9, adult: Secondary | ICD-10-CM | POA: Diagnosis not present

## 2017-12-26 DIAGNOSIS — E782 Mixed hyperlipidemia: Secondary | ICD-10-CM | POA: Diagnosis not present

## 2017-12-26 DIAGNOSIS — R748 Abnormal levels of other serum enzymes: Secondary | ICD-10-CM | POA: Diagnosis not present

## 2017-12-26 DIAGNOSIS — N183 Chronic kidney disease, stage 3 (moderate): Secondary | ICD-10-CM | POA: Diagnosis not present

## 2017-12-26 DIAGNOSIS — Z1331 Encounter for screening for depression: Secondary | ICD-10-CM | POA: Diagnosis not present

## 2017-12-26 DIAGNOSIS — R7301 Impaired fasting glucose: Secondary | ICD-10-CM | POA: Diagnosis not present

## 2018-05-21 ENCOUNTER — Ambulatory Visit: Payer: PPO

## 2018-05-21 ENCOUNTER — Ambulatory Visit
Admission: RE | Admit: 2018-05-21 | Discharge: 2018-05-21 | Disposition: A | Discharge status: Home / Self Care | Payer: PPO | Source: Ambulatory Visit | Attending: Obstetrics and Gynecology | Admitting: Obstetrics and Gynecology

## 2018-05-21 DIAGNOSIS — Z1231 Encounter for screening mammogram for malignant neoplasm of breast: Secondary | ICD-10-CM | POA: Diagnosis not present

## 2018-05-21 DIAGNOSIS — Z780 Asymptomatic menopausal state: Secondary | ICD-10-CM | POA: Diagnosis not present

## 2018-05-21 DIAGNOSIS — M8589 Other specified disorders of bone density and structure, multiple sites: Secondary | ICD-10-CM | POA: Diagnosis not present

## 2018-06-24 NOTE — Progress Notes (Signed)
67 y.o. G80P2002 Married Caucasian female here for annual exam.    Not needing to use Clobetasol ointment.   Having some back pain and will see her PCP.  Activity does not seem to make a difference.  Saw chiropractor.   Night time voiding - 2 - 3 times.  Drinking water during the day.  Labs with PCP next week.  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   PCP:   Fara Chute, MD  Patient's last menstrual period was 09/25/1979.     Period Cycle (Days): (postmenopausal, hysterectomy)     Sexually active: Yes.    The current method of family planning is status post hysterectomy.    Exercising: No.  The patient does not participate in regular exercise at present. Smoker:  no  Health Maintenance: Pap:  2002 negative History of abnormal Pap:  no MMG:  05/21/2018 BI-RADS CATEGORY  1: Negative. Colonoscopy:  2016 - normal - Dr. Kinnie Scales.  Due in 2026.  BMD:   05/21/2018  Result  osteopenic TDaP:  2012 Gardasil:   no ZOX:WRUEAV Hep C: unsure Screening Labs:  Hb today: n/a, Urine today: n/a Having flu vaccine next week.    reports that she has never smoked. She has never used smokeless tobacco. She reports that she does not drink alcohol or use drugs.  Past Medical History:  Diagnosis Date  . Elevated hemoglobin A1c 2017   5.9  . Elevated liver enzymes 2017   AST - 70, ALT - 65  . Elevated serum creatinine 2017   1.05  . Gout of big toe   . Hearing loss of both ears    Right ear hears less well.  . Hyperlipidemia   . Hypertension   . Hypothyroidism    Not on medication.  . Lichen sclerosus et atrophicus of the vulva 2017  . Osteoporosis   . Sebaceous cyst    I & D    Past Surgical History:  Procedure Laterality Date  . BARTHOLIN GLAND CYST EXCISION N/A 1985  . dental implants    . TOTAL ABDOMINAL HYSTERECTOMY  1981   left salpingo-oophorectomy and appendectomy - dysfunctional uterine bleeding    Current Outpatient Medications  Medication Sig Dispense Refill  . Alfalfa  500 MG TABS Take by mouth.    Marland Kitchen allopurinol (ZYLOPRIM) 300 MG tablet     . atenolol (TENORMIN) 100 MG tablet Take 1 tablet by mouth 2 (two) times daily.  2  . Calcium Carbonate-Vitamin D (CALCIUM-VITAMIN D) 500-200 MG-UNIT per tablet Take 1 tablet by mouth 2 (two) times daily with a meal.    . cetirizine (ZYRTEC) 10 MG tablet Take 10 mg by mouth daily. Dosage not listed.    . clobetasol ointment (TEMOVATE) 0.05 % Apply 1 application topically 2 (two) times daily. Use for one week for flares of lichen sclerosus. 60 g 0  . Flaxseed, Linseed, (FLAXSEED OIL PO) Take by mouth daily.    . hydrochlorothiazide (HYDRODIURIL) 25 MG tablet Take 25 mg by mouth daily.    . indomethacin (INDOCIN) 50 MG capsule Take 1 capsule by mouth as needed. 1 TABLET prn as needed for Gout flare up.  0  . levothyroxine (SYNTHROID, LEVOTHROID) 50 MCG tablet Take 50 mcg by mouth daily.  3  . Multiple Vitamin (MULTIVITAMIN) tablet Take 1 tablet by mouth daily.    . RESTASIS 0.05 % ophthalmic emulsion     . simvastatin (ZOCOR) 20 MG tablet Take 20 mg by mouth every evening.  No current facility-administered medications for this visit.     Family History  Problem Relation Age of Onset  . Lymphoma Mother        Non Hodgkins  . Hypertension Mother   . Alzheimer's disease Mother   . Osteoporosis Mother   . ALS Father   . Thrombophlebitis Other     Review of Systems  Constitutional: Negative.   HENT: Negative.   Eyes: Negative.   Respiratory: Negative.   Cardiovascular: Negative.   Gastrointestinal: Negative.   Endocrine: Negative.   Genitourinary:       Nocturia  Musculoskeletal: Positive for back pain.  Skin: Negative.   Allergic/Immunologic: Negative.   Neurological: Negative.   Hematological: Negative.   Psychiatric/Behavioral: Negative.   All other systems reviewed and are negative.   Exam:   BP 122/82   Pulse 66   Ht 5' 3.5" (1.613 m)   Wt 182 lb (82.6 kg)   LMP 09/25/1979   SpO2 98%   BMI  31.73 kg/m     General appearance: alert, cooperative and appears stated age Head: Normocephalic, without obvious abnormality, atraumatic Neck: no adenopathy, supple, symmetrical, trachea midline and thyroid normal to inspection and palpation Lungs: clear to auscultation bilaterally Breasts: normal appearance, no masses or tenderness, No nipple retraction or dimpling, No nipple discharge or bleeding, No axillary or supraclavicular adenopathy Heart: regular rate and rhythm Abdomen: soft, non-tender; no masses, no organomegaly Extremities: extremities normal, atraumatic, no cyanosis or edema Skin: Skin color, texture, turgor normal. No rashes or lesions Lymph nodes: Cervical, supraclavicular, and axillary nodes normal. No abnormal inguinal nodes palpated Neurologic: Grossly normal  Pelvic: External genitalia:   Hypopigmentation of the vulva              Urethra:  normal appearing urethra with no masses, tenderness or lesions              Bartholins and Skenes: normal                 Vagina: normal appearing vagina with normal color and discharge, no lesions              Cervix:  absent              Pap taken: No. Bimanual Exam:  Uterus:   absent              Adnexa: no mass, fullness, tenderness              Rectal exam: Yes.  .  Confirms.              Anus:  normal sphincter tone, no lesions  Chaperone was present for exam.  Assessment:   Well woman visit with normal exam. Status post TAH/LSO/appy. Osteopenia.  Hx hypothyroidism. Followed by PCP.  Lichen sclerosus.  Asymptomatic.   Plan: Mammogram screening. Recommended self breast awareness. Pap and HR HPV as above. Guidelines for Calcium, Vitamin D, regular exercise program including cardiovascular and weight bearing exercise. Next BMD in 2 years.  Follow up annually and prn.   After visit summary provided.

## 2018-06-27 ENCOUNTER — Encounter: Payer: Self-pay | Admitting: Obstetrics and Gynecology

## 2018-06-27 ENCOUNTER — Ambulatory Visit (INDEPENDENT_AMBULATORY_CARE_PROVIDER_SITE_OTHER): Payer: PPO | Admitting: Obstetrics and Gynecology

## 2018-06-27 ENCOUNTER — Other Ambulatory Visit: Payer: Self-pay

## 2018-06-27 VITALS — BP 122/82 | HR 66 | Ht 63.5 in | Wt 182.0 lb

## 2018-06-27 DIAGNOSIS — Z01419 Encounter for gynecological examination (general) (routine) without abnormal findings: Secondary | ICD-10-CM

## 2018-06-27 NOTE — Patient Instructions (Signed)

## 2018-07-02 DIAGNOSIS — E782 Mixed hyperlipidemia: Secondary | ICD-10-CM | POA: Diagnosis not present

## 2018-07-02 DIAGNOSIS — N183 Chronic kidney disease, stage 3 (moderate): Secondary | ICD-10-CM | POA: Diagnosis not present

## 2018-07-02 DIAGNOSIS — R739 Hyperglycemia, unspecified: Secondary | ICD-10-CM | POA: Diagnosis not present

## 2018-07-02 DIAGNOSIS — E7800 Pure hypercholesterolemia, unspecified: Secondary | ICD-10-CM | POA: Diagnosis not present

## 2018-07-02 DIAGNOSIS — R748 Abnormal levels of other serum enzymes: Secondary | ICD-10-CM | POA: Diagnosis not present

## 2018-07-02 DIAGNOSIS — I10 Essential (primary) hypertension: Secondary | ICD-10-CM | POA: Diagnosis not present

## 2018-07-04 DIAGNOSIS — E782 Mixed hyperlipidemia: Secondary | ICD-10-CM | POA: Diagnosis not present

## 2018-07-04 DIAGNOSIS — R7301 Impaired fasting glucose: Secondary | ICD-10-CM | POA: Diagnosis not present

## 2018-07-04 DIAGNOSIS — N183 Chronic kidney disease, stage 3 (moderate): Secondary | ICD-10-CM | POA: Diagnosis not present

## 2018-07-04 DIAGNOSIS — Z23 Encounter for immunization: Secondary | ICD-10-CM | POA: Diagnosis not present

## 2018-07-04 DIAGNOSIS — M109 Gout, unspecified: Secondary | ICD-10-CM | POA: Diagnosis not present

## 2018-07-04 DIAGNOSIS — R748 Abnormal levels of other serum enzymes: Secondary | ICD-10-CM | POA: Diagnosis not present

## 2018-07-04 DIAGNOSIS — Z6832 Body mass index (BMI) 32.0-32.9, adult: Secondary | ICD-10-CM | POA: Diagnosis not present

## 2018-07-04 DIAGNOSIS — I10 Essential (primary) hypertension: Secondary | ICD-10-CM | POA: Diagnosis not present

## 2019-01-06 DIAGNOSIS — I10 Essential (primary) hypertension: Secondary | ICD-10-CM | POA: Diagnosis not present

## 2019-01-06 DIAGNOSIS — E860 Dehydration: Secondary | ICD-10-CM | POA: Diagnosis not present

## 2019-01-06 DIAGNOSIS — R7301 Impaired fasting glucose: Secondary | ICD-10-CM | POA: Diagnosis not present

## 2019-01-06 DIAGNOSIS — E039 Hypothyroidism, unspecified: Secondary | ICD-10-CM | POA: Diagnosis not present

## 2019-01-06 DIAGNOSIS — K210 Gastro-esophageal reflux disease with esophagitis: Secondary | ICD-10-CM | POA: Diagnosis not present

## 2019-01-06 DIAGNOSIS — R748 Abnormal levels of other serum enzymes: Secondary | ICD-10-CM | POA: Diagnosis not present

## 2019-01-06 DIAGNOSIS — E7800 Pure hypercholesterolemia, unspecified: Secondary | ICD-10-CM | POA: Diagnosis not present

## 2019-01-06 DIAGNOSIS — E782 Mixed hyperlipidemia: Secondary | ICD-10-CM | POA: Diagnosis not present

## 2019-01-06 DIAGNOSIS — G4733 Obstructive sleep apnea (adult) (pediatric): Secondary | ICD-10-CM | POA: Diagnosis not present

## 2019-01-08 DIAGNOSIS — E039 Hypothyroidism, unspecified: Secondary | ICD-10-CM | POA: Diagnosis not present

## 2019-01-08 DIAGNOSIS — R7301 Impaired fasting glucose: Secondary | ICD-10-CM | POA: Diagnosis not present

## 2019-01-08 DIAGNOSIS — I10 Essential (primary) hypertension: Secondary | ICD-10-CM | POA: Diagnosis not present

## 2019-01-08 DIAGNOSIS — E782 Mixed hyperlipidemia: Secondary | ICD-10-CM | POA: Diagnosis not present

## 2019-01-08 DIAGNOSIS — Z6833 Body mass index (BMI) 33.0-33.9, adult: Secondary | ICD-10-CM | POA: Diagnosis not present

## 2019-01-08 DIAGNOSIS — R748 Abnormal levels of other serum enzymes: Secondary | ICD-10-CM | POA: Diagnosis not present

## 2019-01-08 DIAGNOSIS — N183 Chronic kidney disease, stage 3 (moderate): Secondary | ICD-10-CM | POA: Diagnosis not present

## 2019-01-08 DIAGNOSIS — M109 Gout, unspecified: Secondary | ICD-10-CM | POA: Diagnosis not present

## 2019-05-01 ENCOUNTER — Other Ambulatory Visit: Payer: Self-pay | Admitting: Family Medicine

## 2019-05-01 ENCOUNTER — Other Ambulatory Visit: Payer: Self-pay | Admitting: Obstetrics and Gynecology

## 2019-05-01 DIAGNOSIS — Z1231 Encounter for screening mammogram for malignant neoplasm of breast: Secondary | ICD-10-CM

## 2019-05-28 ENCOUNTER — Ambulatory Visit
Admission: RE | Admit: 2019-05-28 | Discharge: 2019-05-28 | Disposition: A | Discharge status: Home / Self Care | Payer: PPO | Source: Ambulatory Visit

## 2019-05-28 ENCOUNTER — Other Ambulatory Visit: Payer: Self-pay

## 2019-05-28 DIAGNOSIS — Z1231 Encounter for screening mammogram for malignant neoplasm of breast: Secondary | ICD-10-CM

## 2019-07-06 ENCOUNTER — Other Ambulatory Visit: Payer: Self-pay

## 2019-07-07 DIAGNOSIS — R739 Hyperglycemia, unspecified: Secondary | ICD-10-CM | POA: Diagnosis not present

## 2019-07-07 DIAGNOSIS — E7800 Pure hypercholesterolemia, unspecified: Secondary | ICD-10-CM | POA: Diagnosis not present

## 2019-07-07 DIAGNOSIS — E039 Hypothyroidism, unspecified: Secondary | ICD-10-CM | POA: Diagnosis not present

## 2019-07-07 NOTE — Progress Notes (Signed)
68 y.o. G102P2002 Married Caucasian female here for annual exam.    Not using the clobetasol often.   Some back pain.  Has seen a chiropractor.  Has a little scoliosis.   Hoping to have family visit for Thanksgiving.   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  She is still working part time at other offices.   PCP:  Consuello Masse, MD   Patient's last menstrual period was 09/25/1979.           Sexually active: Yes.    The current method of family planning is status post hysterectomy.    Exercising: No.  The patient does not participate in regular exercise at present. Smoker:  no  Health Maintenance: Pap: 2002 Neg History of abnormal Pap:  no MMG: 05-28-19 3D/Neg/density B/BiRads1 Colonoscopy: 2016 normal;next 2026 BMD: 05-21-18 Result: Osteopenia TDaP:  2012 Gardasil:   n/a ZCH:[id number] Hep C:unsure Screening Labs:  PCP   reports that she has never smoked. She has never used smokeless tobacco. She reports that she does not drink alcohol or use drugs.  Past Medical History:  Diagnosis Date  . Elevated hemoglobin A1c 2017   5.9  . Elevated liver enzymes 2017   AST - 70, ALT - 65  . Elevated serum creatinine 2017   1.05  . Gout of big toe   . Hearing loss of both ears    Right ear hears less well.  . Hyperlipidemia   . Hypertension   . Hypothyroidism    Not on medication.  . Lichen sclerosus et atrophicus of the vulva 2017  . Osteoporosis   . Sebaceous cyst    I & D    Past Surgical History:  Procedure Laterality Date  . BARTHOLIN GLAND CYST EXCISION N/A 1985  . dental implants    . TOTAL ABDOMINAL HYSTERECTOMY  1981   left salpingo-oophorectomy and appendectomy - dysfunctional uterine bleeding    Current Outpatient Medications  Medication Sig Dispense Refill  . allopurinol (ZYLOPRIM) 300 MG tablet     . atenolol (TENORMIN) 100 MG tablet Take 1 tablet by mouth 2 (two) times daily.  2  . Calcium Carbonate-Vitamin D (CALCIUM-VITAMIN D) 500-200 MG-UNIT per  tablet Take 1 tablet by mouth 2 (two) times daily with a meal.    . cetirizine (ZYRTEC) 10 MG tablet Take 10 mg by mouth daily. Dosage not listed.    . clobetasol ointment (TEMOVATE) 7.41 % Apply 1 application topically 2 (two) times daily. Use for one week for flares of lichen sclerosus. 60 g 0  . hydrochlorothiazide (HYDRODIURIL) 25 MG tablet Take 25 mg by mouth daily.    . indomethacin (INDOCIN) 50 MG capsule Take 1 capsule by mouth as needed. 1 TABLET prn as needed for Gout flare up.  0  . levothyroxine (SYNTHROID) 75 MCG tablet Take 75 mcg by mouth daily.    . Multiple Vitamin (MULTIVITAMIN) tablet Take 1 tablet by mouth daily.    . RESTASIS 0.05 % ophthalmic emulsion     . simvastatin (ZOCOR) 20 MG tablet Take 20 mg by mouth every evening.     No current facility-administered medications for this visit.     Family History  Problem Relation Age of Onset  . Lymphoma Mother        Non Hodgkins  . Hypertension Mother   . Alzheimer's disease Mother   . Osteoporosis Mother   . ALS Father   . Thrombophlebitis Other   . Breast cancer Neg Hx  Review of Systems  All other systems reviewed and are negative.   Exam:   BP 128/80 (Cuff Size: Large)   Pulse 68   Temp 98 F (36.7 C) (Temporal)   Resp 16   Ht 5\' 3"  (1.6 m)   Wt 200 lb (90.7 kg)   LMP 09/25/1979   BMI 35.43 kg/m     General appearance: alert, cooperative and appears stated age Head: normocephalic, without obvious abnormality, atraumatic Neck: no adenopathy, supple, symmetrical, trachea midline and thyroid normal to inspection and palpation Lungs: clear to auscultation bilaterally Breasts: normal appearance, no masses or tenderness, No nipple retraction or dimpling, No nipple discharge or bleeding, No axillary adenopathy Heart: regular rate and rhythm Abdomen: soft, non-tender; no masses, no organomegaly Extremities: extremities normal, atraumatic, no cyanosis or edema Skin: skin color, texture, turgor normal.  No rashes or lesions Lymph nodes: cervical, supraclavicular, and axillary nodes normal. Neurologic: grossly normal  Pelvic: External genitalia:   Hypopigmented change of the vulva in a scattered fashion of bilateral labia majora and perineum.               No abnormal inguinal nodes palpated.              Urethra:  normal appearing urethra with no masses, tenderness or lesions              Bartholins and Skenes: normal                 Vagina: normal appearing vagina with normal color and discharge, no lesions              Cervix:  absent              Pap taken: No. Bimanual Exam:  Uterus:  absent              Adnexa: no mass, fullness, tenderness              Rectal exam: Yes.  .  Confirms.              Anus:  normal sphincter tone, no lesions  Chaperone was present for exam.  Assessment:   Well woman visit with normal exam. Status post TAH/LSO/appy. Osteopenia of hip and spine. Low fracture risk on FRAX.  Hx hypothyroidism. Followed by PCP.  Lichen sclerosus. Asymptomatic.    Plan: Mammogram screening discussed. Self breast awareness reviewed. Pap and HR HPV as above. Guidelines for Calcium, Vitamin D, regular exercise program including cardiovascular and weight bearing exercise. BMD next year. Flu vaccine today.  She will call if she needs a refill of Clobetasol. Follow up annually and prn.   After visit summary provided.

## 2019-07-08 ENCOUNTER — Encounter: Payer: Self-pay | Admitting: Obstetrics and Gynecology

## 2019-07-08 ENCOUNTER — Other Ambulatory Visit: Payer: Self-pay

## 2019-07-08 ENCOUNTER — Ambulatory Visit (INDEPENDENT_AMBULATORY_CARE_PROVIDER_SITE_OTHER): Payer: PPO | Admitting: Obstetrics and Gynecology

## 2019-07-08 VITALS — BP 128/80 | HR 68 | Temp 98.0°F | Resp 16 | Ht 63.0 in | Wt 200.0 lb

## 2019-07-08 DIAGNOSIS — M8580 Other specified disorders of bone density and structure, unspecified site: Secondary | ICD-10-CM

## 2019-07-08 DIAGNOSIS — Z23 Encounter for immunization: Secondary | ICD-10-CM | POA: Diagnosis not present

## 2019-07-08 DIAGNOSIS — Z780 Asymptomatic menopausal state: Secondary | ICD-10-CM | POA: Diagnosis not present

## 2019-07-08 DIAGNOSIS — Z01419 Encounter for gynecological examination (general) (routine) without abnormal findings: Secondary | ICD-10-CM

## 2019-07-08 NOTE — Patient Instructions (Signed)

## 2019-07-09 DIAGNOSIS — R748 Abnormal levels of other serum enzymes: Secondary | ICD-10-CM | POA: Diagnosis not present

## 2019-07-09 DIAGNOSIS — N189 Chronic kidney disease, unspecified: Secondary | ICD-10-CM | POA: Diagnosis not present

## 2019-07-09 DIAGNOSIS — R7301 Impaired fasting glucose: Secondary | ICD-10-CM | POA: Diagnosis not present

## 2019-07-09 DIAGNOSIS — Z0000 Encounter for general adult medical examination without abnormal findings: Secondary | ICD-10-CM | POA: Diagnosis not present

## 2019-07-09 DIAGNOSIS — M109 Gout, unspecified: Secondary | ICD-10-CM | POA: Diagnosis not present

## 2019-07-09 DIAGNOSIS — E039 Hypothyroidism, unspecified: Secondary | ICD-10-CM | POA: Diagnosis not present

## 2019-07-09 DIAGNOSIS — I10 Essential (primary) hypertension: Secondary | ICD-10-CM | POA: Diagnosis not present

## 2019-07-09 DIAGNOSIS — Z6833 Body mass index (BMI) 33.0-33.9, adult: Secondary | ICD-10-CM | POA: Diagnosis not present

## 2019-07-24 DIAGNOSIS — I10 Essential (primary) hypertension: Secondary | ICD-10-CM | POA: Diagnosis not present

## 2019-07-24 DIAGNOSIS — N183 Chronic kidney disease, stage 3 unspecified: Secondary | ICD-10-CM | POA: Diagnosis not present

## 2019-08-24 DIAGNOSIS — I10 Essential (primary) hypertension: Secondary | ICD-10-CM | POA: Diagnosis not present

## 2019-08-24 DIAGNOSIS — E039 Hypothyroidism, unspecified: Secondary | ICD-10-CM | POA: Diagnosis not present

## 2019-08-25 DIAGNOSIS — Z6834 Body mass index (BMI) 34.0-34.9, adult: Secondary | ICD-10-CM | POA: Diagnosis not present

## 2019-08-25 DIAGNOSIS — M25562 Pain in left knee: Secondary | ICD-10-CM | POA: Diagnosis not present

## 2019-11-08 ENCOUNTER — Ambulatory Visit: Payer: PPO | Attending: Internal Medicine

## 2019-11-08 DIAGNOSIS — Z23 Encounter for immunization: Secondary | ICD-10-CM | POA: Insufficient documentation

## 2019-11-08 NOTE — Progress Notes (Signed)
   Covid-19 Vaccination Clinic  Name:  Kristin Houston    MRN: [id number] DOB: April 10, 1951  11/08/2019  Kristin Houston was observed post Covid-19 immunization for 15 minutes without incidence. She was provided with Vaccine Information Sheet and instruction to access the V-Safe system.   Kristin Houston was instructed to call 911 with any severe reactions post vaccine: Marland Kitchen Difficulty breathing  . Swelling of your face and throat  . A fast heartbeat  . A bad rash all over your body  . Dizziness and weakness    Immunizations Administered    Name Date Dose VIS Date Route   Pfizer COVID-19 Vaccine 11/08/2019  8:11 AM 0.3 mL 09/04/2019 Intramuscular   Manufacturer: [employer], [employer]   Lot: [id number]   NDC: [address]-4

## 2019-11-28 ENCOUNTER — Ambulatory Visit: Payer: PPO | Attending: Internal Medicine

## 2019-11-28 DIAGNOSIS — Z23 Encounter for immunization: Secondary | ICD-10-CM | POA: Insufficient documentation

## 2019-11-28 NOTE — Progress Notes (Signed)
   Covid-19 Vaccination Clinic  Name:  Kristin Houston    MRN: [id number] DOB: Jul 30, 1951  11/28/2019  Kristin Houston was observed post Covid-19 immunization for 15 minutes without incident. She was provided with Vaccine Information Sheet and instruction to access the V-Safe system.   Kristin Houston was instructed to call 911 with any severe reactions post vaccine: Marland Kitchen Difficulty breathing  . Swelling of face and throat  . A fast heartbeat  . A bad rash all over body  . Dizziness and weakness   Immunizations Administered    Name Date Dose VIS Date Route   Pfizer COVID-19 Vaccine 11/28/2019  8:18 AM 0.3 mL 09/04/2019 Intramuscular   Manufacturer: [employer], [employer]   Lot: [id number]   NDC: [address]-1

## 2019-12-23 DIAGNOSIS — I10 Essential (primary) hypertension: Secondary | ICD-10-CM | POA: Diagnosis not present

## 2019-12-23 DIAGNOSIS — E7849 Other hyperlipidemia: Secondary | ICD-10-CM | POA: Diagnosis not present

## 2020-01-08 DIAGNOSIS — N183 Chronic kidney disease, stage 3 unspecified: Secondary | ICD-10-CM | POA: Diagnosis not present

## 2020-01-08 DIAGNOSIS — E7800 Pure hypercholesterolemia, unspecified: Secondary | ICD-10-CM | POA: Diagnosis not present

## 2020-01-08 DIAGNOSIS — E782 Mixed hyperlipidemia: Secondary | ICD-10-CM | POA: Diagnosis not present

## 2020-01-08 DIAGNOSIS — R7301 Impaired fasting glucose: Secondary | ICD-10-CM | POA: Diagnosis not present

## 2020-01-08 DIAGNOSIS — R748 Abnormal levels of other serum enzymes: Secondary | ICD-10-CM | POA: Diagnosis not present

## 2020-01-08 DIAGNOSIS — E039 Hypothyroidism, unspecified: Secondary | ICD-10-CM | POA: Diagnosis not present

## 2020-01-08 DIAGNOSIS — M109 Gout, unspecified: Secondary | ICD-10-CM | POA: Diagnosis not present

## 2020-01-11 DIAGNOSIS — R748 Abnormal levels of other serum enzymes: Secondary | ICD-10-CM | POA: Diagnosis not present

## 2020-01-11 DIAGNOSIS — I10 Essential (primary) hypertension: Secondary | ICD-10-CM | POA: Diagnosis not present

## 2020-01-11 DIAGNOSIS — M109 Gout, unspecified: Secondary | ICD-10-CM | POA: Diagnosis not present

## 2020-01-11 DIAGNOSIS — R7301 Impaired fasting glucose: Secondary | ICD-10-CM | POA: Diagnosis not present

## 2020-01-11 DIAGNOSIS — E782 Mixed hyperlipidemia: Secondary | ICD-10-CM | POA: Diagnosis not present

## 2020-01-11 DIAGNOSIS — Z6833 Body mass index (BMI) 33.0-33.9, adult: Secondary | ICD-10-CM | POA: Diagnosis not present

## 2020-01-11 DIAGNOSIS — E039 Hypothyroidism, unspecified: Secondary | ICD-10-CM | POA: Diagnosis not present

## 2020-02-22 DIAGNOSIS — I129 Hypertensive chronic kidney disease with stage 1 through stage 4 chronic kidney disease, or unspecified chronic kidney disease: Secondary | ICD-10-CM | POA: Diagnosis not present

## 2020-02-22 DIAGNOSIS — E039 Hypothyroidism, unspecified: Secondary | ICD-10-CM | POA: Diagnosis not present

## 2020-02-22 DIAGNOSIS — N183 Chronic kidney disease, stage 3 unspecified: Secondary | ICD-10-CM | POA: Diagnosis not present

## 2020-02-22 DIAGNOSIS — E7849 Other hyperlipidemia: Secondary | ICD-10-CM | POA: Diagnosis not present

## 2020-02-25 DIAGNOSIS — M545 Low back pain: Secondary | ICD-10-CM | POA: Diagnosis not present

## 2020-02-25 DIAGNOSIS — M25552 Pain in left hip: Secondary | ICD-10-CM | POA: Diagnosis not present

## 2020-02-25 DIAGNOSIS — Z6834 Body mass index (BMI) 34.0-34.9, adult: Secondary | ICD-10-CM | POA: Diagnosis not present

## 2020-03-08 ENCOUNTER — Other Ambulatory Visit: Payer: Self-pay | Admitting: Obstetrics and Gynecology

## 2020-03-08 DIAGNOSIS — Z1231 Encounter for screening mammogram for malignant neoplasm of breast: Secondary | ICD-10-CM

## 2020-03-08 DIAGNOSIS — E2839 Other primary ovarian failure: Secondary | ICD-10-CM

## 2020-04-06 DIAGNOSIS — Z1389 Encounter for screening for other disorder: Secondary | ICD-10-CM | POA: Diagnosis not present

## 2020-04-06 DIAGNOSIS — Z6834 Body mass index (BMI) 34.0-34.9, adult: Secondary | ICD-10-CM | POA: Diagnosis not present

## 2020-04-06 DIAGNOSIS — M545 Low back pain: Secondary | ICD-10-CM | POA: Diagnosis not present

## 2020-04-06 DIAGNOSIS — Z1331 Encounter for screening for depression: Secondary | ICD-10-CM | POA: Diagnosis not present

## 2020-05-24 DIAGNOSIS — E7849 Other hyperlipidemia: Secondary | ICD-10-CM | POA: Diagnosis not present

## 2020-05-24 DIAGNOSIS — E039 Hypothyroidism, unspecified: Secondary | ICD-10-CM | POA: Diagnosis not present

## 2020-05-24 DIAGNOSIS — I129 Hypertensive chronic kidney disease with stage 1 through stage 4 chronic kidney disease, or unspecified chronic kidney disease: Secondary | ICD-10-CM | POA: Diagnosis not present

## 2020-05-24 DIAGNOSIS — N183 Chronic kidney disease, stage 3 unspecified: Secondary | ICD-10-CM | POA: Diagnosis not present

## 2020-06-01 ENCOUNTER — Other Ambulatory Visit: Payer: Self-pay

## 2020-06-01 ENCOUNTER — Ambulatory Visit
Admission: RE | Admit: 2020-06-01 | Discharge: 2020-06-01 | Disposition: A | Discharge status: Home / Self Care | Payer: PPO | Source: Ambulatory Visit | Attending: Obstetrics and Gynecology | Admitting: Obstetrics and Gynecology

## 2020-06-01 DIAGNOSIS — Z780 Asymptomatic menopausal state: Secondary | ICD-10-CM | POA: Diagnosis not present

## 2020-06-01 DIAGNOSIS — M8589 Other specified disorders of bone density and structure, multiple sites: Secondary | ICD-10-CM | POA: Diagnosis not present

## 2020-06-01 DIAGNOSIS — E2839 Other primary ovarian failure: Secondary | ICD-10-CM

## 2020-06-01 DIAGNOSIS — Z1231 Encounter for screening mammogram for malignant neoplasm of breast: Secondary | ICD-10-CM

## 2020-06-23 DIAGNOSIS — I129 Hypertensive chronic kidney disease with stage 1 through stage 4 chronic kidney disease, or unspecified chronic kidney disease: Secondary | ICD-10-CM | POA: Diagnosis not present

## 2020-06-23 DIAGNOSIS — E7849 Other hyperlipidemia: Secondary | ICD-10-CM | POA: Diagnosis not present

## 2020-06-23 DIAGNOSIS — N183 Chronic kidney disease, stage 3 unspecified: Secondary | ICD-10-CM | POA: Diagnosis not present

## 2020-07-11 DIAGNOSIS — N183 Chronic kidney disease, stage 3 unspecified: Secondary | ICD-10-CM | POA: Diagnosis not present

## 2020-07-11 DIAGNOSIS — E782 Mixed hyperlipidemia: Secondary | ICD-10-CM | POA: Diagnosis not present

## 2020-07-11 DIAGNOSIS — K2100 Gastro-esophageal reflux disease with esophagitis, without bleeding: Secondary | ICD-10-CM | POA: Diagnosis not present

## 2020-07-11 DIAGNOSIS — E039 Hypothyroidism, unspecified: Secondary | ICD-10-CM | POA: Diagnosis not present

## 2020-07-11 DIAGNOSIS — R739 Hyperglycemia, unspecified: Secondary | ICD-10-CM | POA: Diagnosis not present

## 2020-07-11 DIAGNOSIS — I10 Essential (primary) hypertension: Secondary | ICD-10-CM | POA: Diagnosis not present

## 2020-07-12 ENCOUNTER — Ambulatory Visit: Payer: PPO | Admitting: Obstetrics and Gynecology

## 2020-07-14 DIAGNOSIS — Z23 Encounter for immunization: Secondary | ICD-10-CM | POA: Diagnosis not present

## 2020-07-14 DIAGNOSIS — E782 Mixed hyperlipidemia: Secondary | ICD-10-CM | POA: Diagnosis not present

## 2020-07-14 DIAGNOSIS — I10 Essential (primary) hypertension: Secondary | ICD-10-CM | POA: Diagnosis not present

## 2020-07-14 DIAGNOSIS — R748 Abnormal levels of other serum enzymes: Secondary | ICD-10-CM | POA: Diagnosis not present

## 2020-07-14 DIAGNOSIS — Z0000 Encounter for general adult medical examination without abnormal findings: Secondary | ICD-10-CM | POA: Diagnosis not present

## 2020-07-14 DIAGNOSIS — R7301 Impaired fasting glucose: Secondary | ICD-10-CM | POA: Diagnosis not present

## 2020-07-14 DIAGNOSIS — Z6833 Body mass index (BMI) 33.0-33.9, adult: Secondary | ICD-10-CM | POA: Diagnosis not present

## 2020-08-17 ENCOUNTER — Ambulatory Visit: Payer: PPO | Admitting: Obstetrics and Gynecology

## 2020-08-23 DIAGNOSIS — E7849 Other hyperlipidemia: Secondary | ICD-10-CM | POA: Diagnosis not present

## 2020-08-23 DIAGNOSIS — I129 Hypertensive chronic kidney disease with stage 1 through stage 4 chronic kidney disease, or unspecified chronic kidney disease: Secondary | ICD-10-CM | POA: Diagnosis not present

## 2020-08-23 DIAGNOSIS — N183 Chronic kidney disease, stage 3 unspecified: Secondary | ICD-10-CM | POA: Diagnosis not present

## 2020-09-07 DIAGNOSIS — Z23 Encounter for immunization: Secondary | ICD-10-CM | POA: Diagnosis not present

## 2020-09-23 DIAGNOSIS — E7849 Other hyperlipidemia: Secondary | ICD-10-CM | POA: Diagnosis not present

## 2020-09-23 DIAGNOSIS — I129 Hypertensive chronic kidney disease with stage 1 through stage 4 chronic kidney disease, or unspecified chronic kidney disease: Secondary | ICD-10-CM | POA: Diagnosis not present

## 2020-09-23 DIAGNOSIS — N183 Chronic kidney disease, stage 3 unspecified: Secondary | ICD-10-CM | POA: Diagnosis not present

## 2020-09-23 DIAGNOSIS — E039 Hypothyroidism, unspecified: Secondary | ICD-10-CM | POA: Diagnosis not present

## 2020-11-21 DIAGNOSIS — E039 Hypothyroidism, unspecified: Secondary | ICD-10-CM | POA: Diagnosis not present

## 2020-11-21 DIAGNOSIS — I129 Hypertensive chronic kidney disease with stage 1 through stage 4 chronic kidney disease, or unspecified chronic kidney disease: Secondary | ICD-10-CM | POA: Diagnosis not present

## 2020-11-21 DIAGNOSIS — N183 Chronic kidney disease, stage 3 unspecified: Secondary | ICD-10-CM | POA: Diagnosis not present

## 2020-11-21 DIAGNOSIS — E7849 Other hyperlipidemia: Secondary | ICD-10-CM | POA: Diagnosis not present

## 2021-01-09 DIAGNOSIS — K2100 Gastro-esophageal reflux disease with esophagitis, without bleeding: Secondary | ICD-10-CM | POA: Diagnosis not present

## 2021-01-09 DIAGNOSIS — I10 Essential (primary) hypertension: Secondary | ICD-10-CM | POA: Diagnosis not present

## 2021-01-09 DIAGNOSIS — R739 Hyperglycemia, unspecified: Secondary | ICD-10-CM | POA: Diagnosis not present

## 2021-01-09 DIAGNOSIS — E039 Hypothyroidism, unspecified: Secondary | ICD-10-CM | POA: Diagnosis not present

## 2021-01-09 DIAGNOSIS — M109 Gout, unspecified: Secondary | ICD-10-CM | POA: Diagnosis not present

## 2021-01-09 DIAGNOSIS — N183 Chronic kidney disease, stage 3 unspecified: Secondary | ICD-10-CM | POA: Diagnosis not present

## 2021-01-13 DIAGNOSIS — E7849 Other hyperlipidemia: Secondary | ICD-10-CM | POA: Diagnosis not present

## 2021-01-13 DIAGNOSIS — R748 Abnormal levels of other serum enzymes: Secondary | ICD-10-CM | POA: Diagnosis not present

## 2021-01-13 DIAGNOSIS — E039 Hypothyroidism, unspecified: Secondary | ICD-10-CM | POA: Diagnosis not present

## 2021-01-13 DIAGNOSIS — R7303 Prediabetes: Secondary | ICD-10-CM | POA: Diagnosis not present

## 2021-01-13 DIAGNOSIS — N289 Disorder of kidney and ureter, unspecified: Secondary | ICD-10-CM | POA: Diagnosis not present

## 2021-01-13 DIAGNOSIS — I10 Essential (primary) hypertension: Secondary | ICD-10-CM | POA: Diagnosis not present

## 2021-01-13 DIAGNOSIS — M109 Gout, unspecified: Secondary | ICD-10-CM | POA: Diagnosis not present

## 2021-01-13 DIAGNOSIS — K2100 Gastro-esophageal reflux disease with esophagitis, without bleeding: Secondary | ICD-10-CM | POA: Diagnosis not present

## 2021-03-23 DIAGNOSIS — N183 Chronic kidney disease, stage 3 unspecified: Secondary | ICD-10-CM | POA: Diagnosis not present

## 2021-03-23 DIAGNOSIS — I129 Hypertensive chronic kidney disease with stage 1 through stage 4 chronic kidney disease, or unspecified chronic kidney disease: Secondary | ICD-10-CM | POA: Diagnosis not present

## 2021-03-23 DIAGNOSIS — E039 Hypothyroidism, unspecified: Secondary | ICD-10-CM | POA: Diagnosis not present

## 2021-03-23 DIAGNOSIS — E7849 Other hyperlipidemia: Secondary | ICD-10-CM | POA: Diagnosis not present

## 2021-04-20 ENCOUNTER — Other Ambulatory Visit: Payer: Self-pay | Admitting: Obstetrics and Gynecology

## 2021-04-20 DIAGNOSIS — Z1231 Encounter for screening mammogram for malignant neoplasm of breast: Secondary | ICD-10-CM

## 2021-05-23 DIAGNOSIS — I129 Hypertensive chronic kidney disease with stage 1 through stage 4 chronic kidney disease, or unspecified chronic kidney disease: Secondary | ICD-10-CM | POA: Diagnosis not present

## 2021-05-23 DIAGNOSIS — E039 Hypothyroidism, unspecified: Secondary | ICD-10-CM | POA: Diagnosis not present

## 2021-05-23 DIAGNOSIS — E7849 Other hyperlipidemia: Secondary | ICD-10-CM | POA: Diagnosis not present

## 2021-05-23 DIAGNOSIS — N183 Chronic kidney disease, stage 3 unspecified: Secondary | ICD-10-CM | POA: Diagnosis not present

## 2021-06-14 ENCOUNTER — Other Ambulatory Visit: Payer: Self-pay

## 2021-06-14 ENCOUNTER — Ambulatory Visit
Admission: RE | Admit: 2021-06-14 | Discharge: 2021-06-14 | Disposition: A | Discharge status: Home / Self Care | Payer: PPO | Source: Ambulatory Visit | Attending: Obstetrics and Gynecology | Admitting: Obstetrics and Gynecology

## 2021-06-14 DIAGNOSIS — Z1231 Encounter for screening mammogram for malignant neoplasm of breast: Secondary | ICD-10-CM | POA: Diagnosis not present

## 2021-06-26 DIAGNOSIS — K2100 Gastro-esophageal reflux disease with esophagitis, without bleeding: Secondary | ICD-10-CM | POA: Diagnosis not present

## 2021-06-26 DIAGNOSIS — E039 Hypothyroidism, unspecified: Secondary | ICD-10-CM | POA: Diagnosis not present

## 2021-06-26 DIAGNOSIS — E782 Mixed hyperlipidemia: Secondary | ICD-10-CM | POA: Diagnosis not present

## 2021-06-26 DIAGNOSIS — I10 Essential (primary) hypertension: Secondary | ICD-10-CM | POA: Diagnosis not present

## 2021-06-26 DIAGNOSIS — R739 Hyperglycemia, unspecified: Secondary | ICD-10-CM | POA: Diagnosis not present

## 2021-06-26 DIAGNOSIS — E7849 Other hyperlipidemia: Secondary | ICD-10-CM | POA: Diagnosis not present

## 2021-06-26 DIAGNOSIS — N183 Chronic kidney disease, stage 3 unspecified: Secondary | ICD-10-CM | POA: Diagnosis not present

## 2021-06-30 DIAGNOSIS — E7849 Other hyperlipidemia: Secondary | ICD-10-CM | POA: Diagnosis not present

## 2021-06-30 DIAGNOSIS — M109 Gout, unspecified: Secondary | ICD-10-CM | POA: Diagnosis not present

## 2021-06-30 DIAGNOSIS — R748 Abnormal levels of other serum enzymes: Secondary | ICD-10-CM | POA: Diagnosis not present

## 2021-06-30 DIAGNOSIS — I10 Essential (primary) hypertension: Secondary | ICD-10-CM | POA: Diagnosis not present

## 2021-06-30 DIAGNOSIS — R7303 Prediabetes: Secondary | ICD-10-CM | POA: Diagnosis not present

## 2021-06-30 DIAGNOSIS — Z1331 Encounter for screening for depression: Secondary | ICD-10-CM | POA: Diagnosis not present

## 2021-06-30 DIAGNOSIS — Z1389 Encounter for screening for other disorder: Secondary | ICD-10-CM | POA: Diagnosis not present

## 2021-06-30 DIAGNOSIS — Z23 Encounter for immunization: Secondary | ICD-10-CM | POA: Diagnosis not present

## 2021-06-30 IMAGING — MG DIGITAL SCREENING BILAT W/ TOMO W/ CAD
8 series · 8 of 24 positions shown · non-contrast
Comparison: Previous exam(s).

CLINICAL DATA: Screening.

EXAM:
DIGITAL SCREENING BILATERAL MAMMOGRAM WITH TOMO AND CAD

[L CC synth-2D]
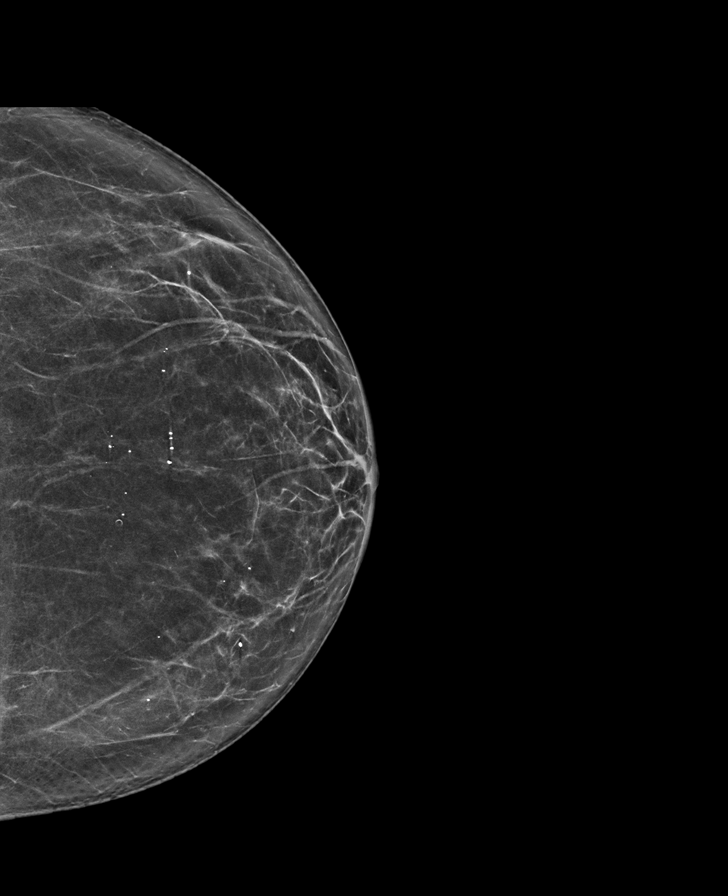

[R MLO synth-2D]
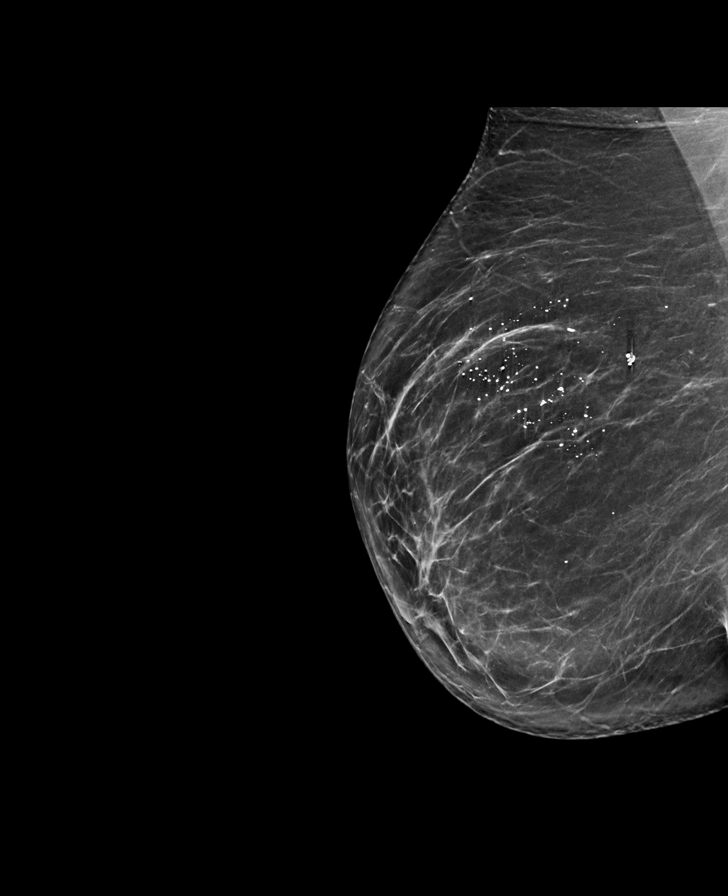

[R CC synth-2D]
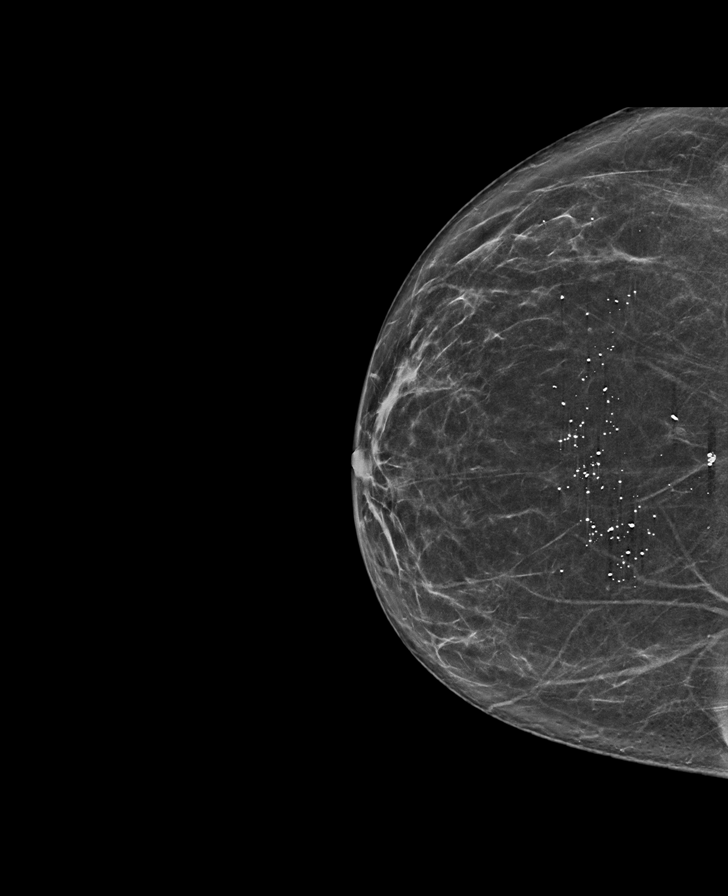

[L MLO synth-2D]
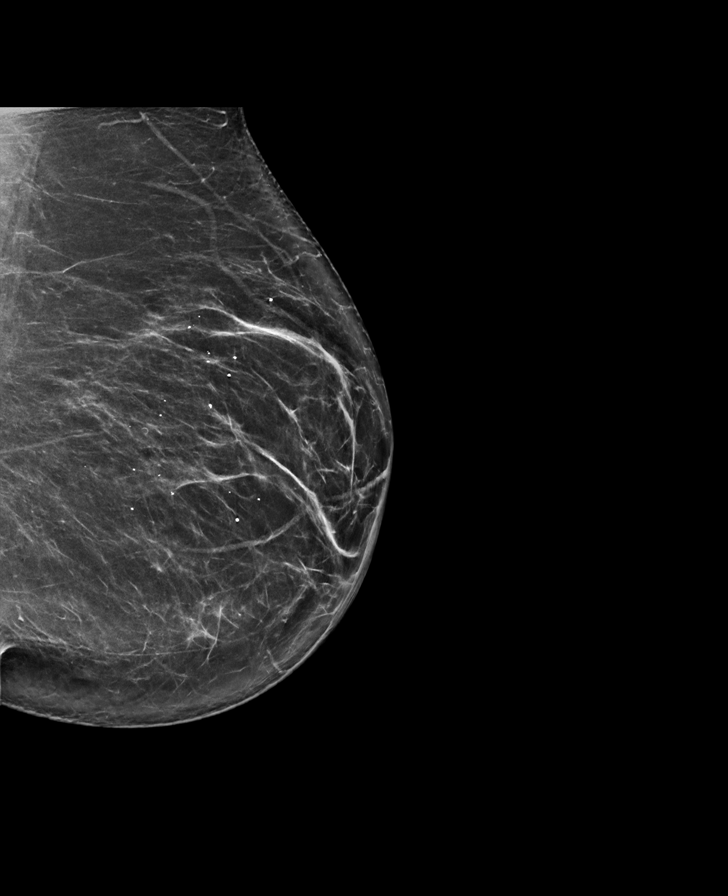

[L CC tomo · tomo slice 36/71.0]
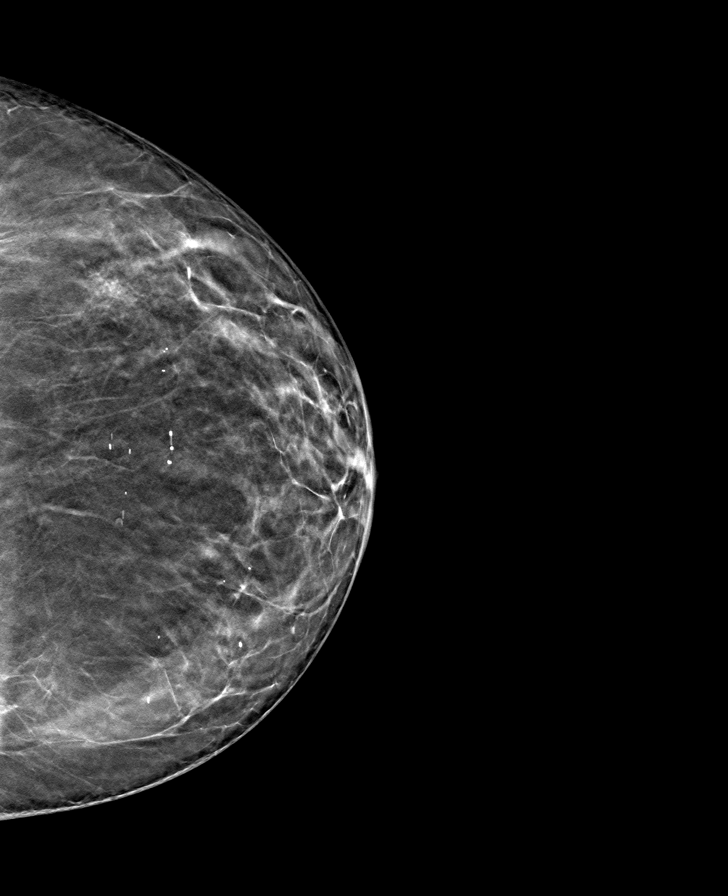

[R CC tomo · tomo slice 37/72.0]
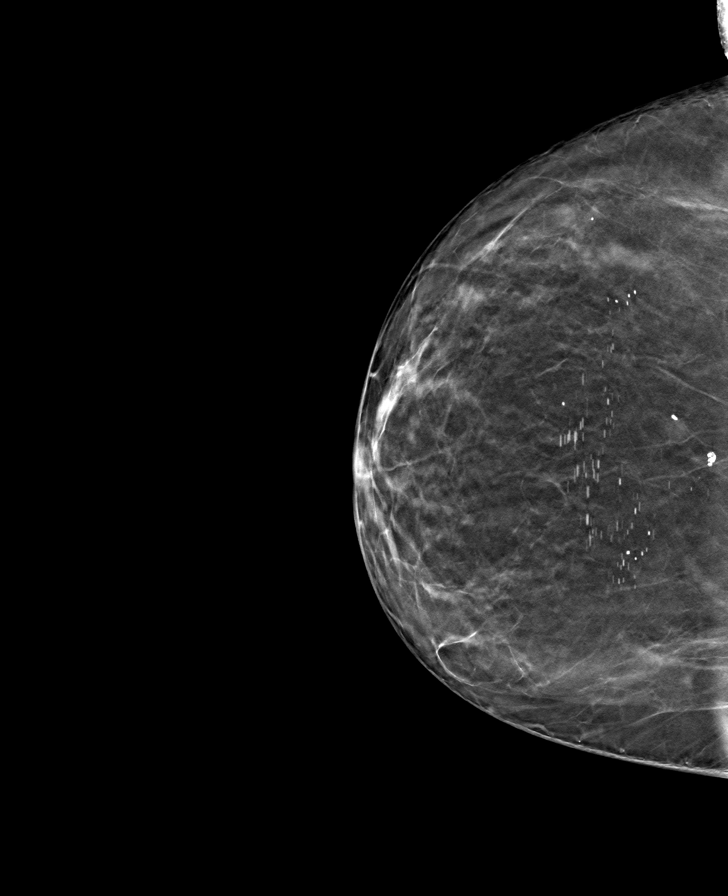

[L MLO tomo · tomo slice 41/80.0]
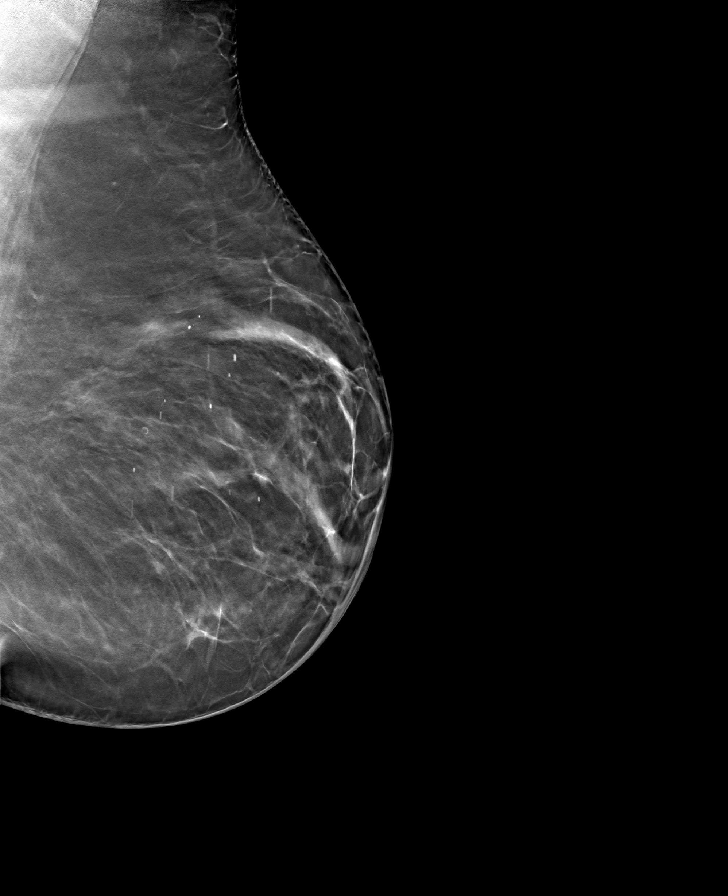

[R MLO tomo · tomo slice 42/83.0]
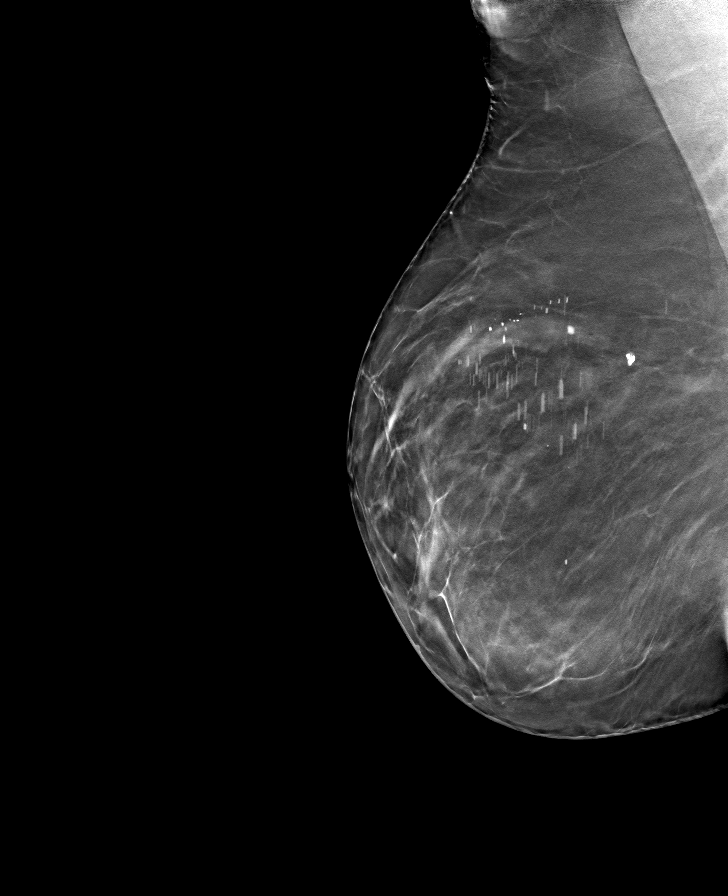

[8 of 24 positions shown; findings below may reference images not displayed]

ACR Breast Density Category b: There are scattered areas of
fibroglandular density.
FINDINGS: There are no findings suspicious for malignancy. Images were
processed with CAD.
IMPRESSION: No mammographic evidence of malignancy. A result letter of this
screening mammogram will be mailed directly to the patient.

RECOMMENDATION:
Screening mammogram in one year. (Code:[id number])

BI-RADS CATEGORY  1: Negative.

## 2021-07-11 NOTE — Progress Notes (Signed)
70 y.o. G32P2002 Married Caucasian female here for breast and pelvic exam.   She is also followed for lichen sclerosus and osteopenia.  Ran out of clobetasol  Some itching and discomfort occasionally.   No concerns about sexual functioning.   PCP:   Fara Chute, MD  Patient's last menstrual period was 09/25/1979.           Sexually active: Yes.    The current method of family planning is status post hysterectomy.    Exercising: No.   exercise Smoker:  no  Health Maintenance: Pap:  2002 neg History of abnormal Pap:  no MMG:  06-14-21 category b density birads 1:neg Colonoscopy:  2016 normal BMD:   06-01-20  Result  osteopenia of hip and spine TDaP:  2012 Gardasil:   no HIV: unsure Hep C: neg yrs ago per patient Screening Labs:  PCP.   reports that she has never smoked. She has never used smokeless tobacco. She reports that she does not drink alcohol and does not use drugs.  Past Medical History:  Diagnosis Date   Elevated hemoglobin A1c 2017   5.9   Elevated liver enzymes 2017   AST - 70, ALT - 65   Elevated serum creatinine 2017   1.05   Gout of big toe    Hearing loss of both ears    Right ear hears less well.   Hyperlipidemia    Hypertension    Hypothyroidism    Not on medication.   Lichen sclerosus et atrophicus of the vulva 2017   Osteoporosis    Sebaceous cyst    I & D    Past Surgical History:  Procedure Laterality Date   BARTHOLIN GLAND CYST EXCISION N/A 1985   dental implants     TOTAL ABDOMINAL HYSTERECTOMY  1981   left salpingo-oophorectomy and appendectomy - dysfunctional uterine bleeding    Current Outpatient Medications  Medication Sig Dispense Refill   allopurinol (ZYLOPRIM) 300 MG tablet      atenolol (TENORMIN) 100 MG tablet Take 1 tablet by mouth 2 (two) times daily.  2   Calcium Carbonate-Vitamin D (CALCIUM-VITAMIN D) 500-200 MG-UNIT per tablet Take 1 tablet by mouth 2 (two) times daily with a meal.     cetirizine (ZYRTEC) 10 MG tablet  Take 10 mg by mouth daily. Dosage not listed.     clobetasol ointment (TEMOVATE) 0.05 % Apply 1 application topically 2 (two) times daily. Use for one week for flares of lichen sclerosus. 60 g 0   hydrochlorothiazide (HYDRODIURIL) 25 MG tablet Take 25 mg by mouth daily.     levothyroxine (SYNTHROID) 88 MCG tablet Take 88 mcg by mouth daily.     Multiple Vitamin (MULTIVITAMIN) tablet Take 1 tablet by mouth daily.     simvastatin (ZOCOR) 20 MG tablet Take 20 mg by mouth every evening.     TYRVAYA 0.03 MG/ACT SOLN Place into both nostrils.     No current facility-administered medications for this visit.    Family History  Problem Relation Age of Onset   Lymphoma Mother        Non Hodgkins   Hypertension Mother    Alzheimer's disease Mother    Osteoporosis Mother    ALS Father    Thrombophlebitis Other    Breast cancer Neg Hx     Review of Systems  Constitutional: Negative.   HENT: Negative.    Eyes: Negative.   Respiratory: Negative.    Cardiovascular: Negative.   Gastrointestinal: Negative.  Endocrine: Negative.   Genitourinary: Negative.   Musculoskeletal: Negative.   Skin: Negative.   Allergic/Immunologic: Negative.   Neurological: Negative.   Hematological: Negative.   Psychiatric/Behavioral: Negative.     Exam:   BP 114/80   Pulse 65   Resp 16   Ht 5' 3.75" (1.619 m)   Wt 186 lb (84.4 kg)   LMP 09/25/1979   BMI 32.18 kg/m     General appearance: alert, cooperative and appears stated age Head: normocephalic, without obvious abnormality, atraumatic Neck: no adenopathy, supple, symmetrical, trachea midline and thyroid normal to inspection and palpation Lungs: clear to auscultation bilaterally Breasts: normal appearance, no masses or tenderness, No nipple retraction or dimpling, No nipple discharge or bleeding, No axillary adenopathy Heart: regular rate and rhythm Abdomen: soft, non-tender; no masses, no organomegaly Extremities: extremities normal, atraumatic,  no cyanosis or edema Skin: skin color, texture, turgor normal. No rashes or lesions Lymph nodes: cervical, supraclavicular, and axillary nodes normal. Neurologic: grossly normal  Pelvic: External genitalia:  hypopigmentation and petechiae of the bilateral labia.  Petechiae noted along the inferior labia near the introitus.               No abnormal inguinal nodes palpated.              Urethra:  normal appearing urethra with no masses, tenderness or lesions              Bartholins and Skenes: normal                 Vagina: normal appearing vagina with normal color and discharge, no lesions              Cervix: absent              Pap taken: no Bimanual Exam:  Uterus:  absent              Adnexa: no mass, fullness, tenderness              Rectal exam: yes.  Confirms.              Anus:  normal sphincter tone, no lesions  Chaperone was present for exam:  Joy, CMA  Assessment:   Well woman visit with gynecologic exam. Status post TAH/LSO/appy. Osteopenia of hip and spine.  Low fracture risk on FRAX.  Hx hypothyroidism.  Followed by PCP.  Lichen sclerosus.   Plan: Mammogram screening discussed. Self breast awareness reviewed. Pap and HR HPV as above. Guidelines for Calcium, Vitamin D, regular exercise program including cardiovascular and weight bearing exercise. I recommend clobetasol ointment bid x 2 weeks and then twice weekly at hs.  BMD in 2023.  TDap.  Follow up annually and prn.   After visit summary provided.   24 min  total time was spent for this patient encounter, including preparation, face-to-face counseling with the patient, coordination of care, and documentation of the encounter.

## 2021-07-14 ENCOUNTER — Ambulatory Visit (INDEPENDENT_AMBULATORY_CARE_PROVIDER_SITE_OTHER): Payer: PPO | Admitting: Obstetrics and Gynecology

## 2021-07-14 ENCOUNTER — Other Ambulatory Visit: Payer: Self-pay

## 2021-07-14 ENCOUNTER — Encounter: Payer: Self-pay | Admitting: Obstetrics and Gynecology

## 2021-07-14 VITALS — BP 114/80 | HR 65 | Resp 16 | Ht 63.75 in | Wt 186.0 lb

## 2021-07-14 DIAGNOSIS — M8580 Other specified disorders of bone density and structure, unspecified site: Secondary | ICD-10-CM | POA: Diagnosis not present

## 2021-07-14 DIAGNOSIS — Z23 Encounter for immunization: Secondary | ICD-10-CM

## 2021-07-14 DIAGNOSIS — L900 Lichen sclerosus et atrophicus: Secondary | ICD-10-CM

## 2021-07-14 DIAGNOSIS — Z780 Asymptomatic menopausal state: Secondary | ICD-10-CM | POA: Diagnosis not present

## 2021-07-14 DIAGNOSIS — Z01419 Encounter for gynecological examination (general) (routine) without abnormal findings: Secondary | ICD-10-CM

## 2021-07-14 MED ORDER — CLOBETASOL PROPIONATE 0.05 % EX OINT: 1.0000 "application " | TOPICAL_OINTMENT | Freq: Two times a day (BID) | CUTANEOUS | 1 refills | Status: DC

## 2021-07-14 NOTE — Patient Instructions (Signed)

## 2021-08-09 DIAGNOSIS — E039 Hypothyroidism, unspecified: Secondary | ICD-10-CM | POA: Diagnosis not present

## 2021-08-27 DIAGNOSIS — Z1211 Encounter for screening for malignant neoplasm of colon: Secondary | ICD-10-CM | POA: Diagnosis not present

## 2021-08-29 DIAGNOSIS — H905 Unspecified sensorineural hearing loss: Secondary | ICD-10-CM | POA: Diagnosis not present

## 2021-09-21 DIAGNOSIS — H905 Unspecified sensorineural hearing loss: Secondary | ICD-10-CM | POA: Diagnosis not present

## 2021-10-30 DIAGNOSIS — K2100 Gastro-esophageal reflux disease with esophagitis, without bleeding: Secondary | ICD-10-CM | POA: Diagnosis not present

## 2021-10-30 DIAGNOSIS — E7849 Other hyperlipidemia: Secondary | ICD-10-CM | POA: Diagnosis not present

## 2021-10-30 DIAGNOSIS — E039 Hypothyroidism, unspecified: Secondary | ICD-10-CM | POA: Diagnosis not present

## 2021-10-30 DIAGNOSIS — R739 Hyperglycemia, unspecified: Secondary | ICD-10-CM | POA: Diagnosis not present

## 2021-10-30 DIAGNOSIS — E782 Mixed hyperlipidemia: Secondary | ICD-10-CM | POA: Diagnosis not present

## 2021-10-30 DIAGNOSIS — N183 Chronic kidney disease, stage 3 unspecified: Secondary | ICD-10-CM | POA: Diagnosis not present

## 2021-10-30 DIAGNOSIS — I10 Essential (primary) hypertension: Secondary | ICD-10-CM | POA: Diagnosis not present

## 2021-11-02 DIAGNOSIS — R7301 Impaired fasting glucose: Secondary | ICD-10-CM | POA: Diagnosis not present

## 2021-11-02 DIAGNOSIS — M109 Gout, unspecified: Secondary | ICD-10-CM | POA: Diagnosis not present

## 2021-11-02 DIAGNOSIS — Z6832 Body mass index (BMI) 32.0-32.9, adult: Secondary | ICD-10-CM | POA: Diagnosis not present

## 2021-11-02 DIAGNOSIS — R748 Abnormal levels of other serum enzymes: Secondary | ICD-10-CM | POA: Diagnosis not present

## 2021-11-02 DIAGNOSIS — E7849 Other hyperlipidemia: Secondary | ICD-10-CM | POA: Diagnosis not present

## 2021-11-02 DIAGNOSIS — E039 Hypothyroidism, unspecified: Secondary | ICD-10-CM | POA: Diagnosis not present

## 2021-11-02 DIAGNOSIS — I10 Essential (primary) hypertension: Secondary | ICD-10-CM | POA: Diagnosis not present

## 2021-11-02 DIAGNOSIS — R7303 Prediabetes: Secondary | ICD-10-CM | POA: Diagnosis not present

## 2022-02-27 DIAGNOSIS — E782 Mixed hyperlipidemia: Secondary | ICD-10-CM | POA: Diagnosis not present

## 2022-02-27 DIAGNOSIS — E039 Hypothyroidism, unspecified: Secondary | ICD-10-CM | POA: Diagnosis not present

## 2022-02-27 DIAGNOSIS — K2100 Gastro-esophageal reflux disease with esophagitis, without bleeding: Secondary | ICD-10-CM | POA: Diagnosis not present

## 2022-02-27 DIAGNOSIS — N183 Chronic kidney disease, stage 3 unspecified: Secondary | ICD-10-CM | POA: Diagnosis not present

## 2022-02-27 DIAGNOSIS — I10 Essential (primary) hypertension: Secondary | ICD-10-CM | POA: Diagnosis not present

## 2022-02-27 DIAGNOSIS — E7800 Pure hypercholesterolemia, unspecified: Secondary | ICD-10-CM | POA: Diagnosis not present

## 2022-02-27 DIAGNOSIS — E7849 Other hyperlipidemia: Secondary | ICD-10-CM | POA: Diagnosis not present

## 2022-02-27 DIAGNOSIS — E7801 Familial hypercholesterolemia: Secondary | ICD-10-CM | POA: Diagnosis not present

## 2022-02-27 DIAGNOSIS — R7301 Impaired fasting glucose: Secondary | ICD-10-CM | POA: Diagnosis not present

## 2022-03-06 DIAGNOSIS — Z0001 Encounter for general adult medical examination with abnormal findings: Secondary | ICD-10-CM | POA: Diagnosis not present

## 2022-03-06 DIAGNOSIS — M109 Gout, unspecified: Secondary | ICD-10-CM | POA: Diagnosis not present

## 2022-03-06 DIAGNOSIS — R7303 Prediabetes: Secondary | ICD-10-CM | POA: Diagnosis not present

## 2022-03-06 DIAGNOSIS — I10 Essential (primary) hypertension: Secondary | ICD-10-CM | POA: Diagnosis not present

## 2022-03-06 DIAGNOSIS — E039 Hypothyroidism, unspecified: Secondary | ICD-10-CM | POA: Diagnosis not present

## 2022-03-06 DIAGNOSIS — E7849 Other hyperlipidemia: Secondary | ICD-10-CM | POA: Diagnosis not present

## 2022-03-06 DIAGNOSIS — Z6833 Body mass index (BMI) 33.0-33.9, adult: Secondary | ICD-10-CM | POA: Diagnosis not present

## 2022-03-06 DIAGNOSIS — R7301 Impaired fasting glucose: Secondary | ICD-10-CM | POA: Diagnosis not present

## 2022-03-06 DIAGNOSIS — K2100 Gastro-esophageal reflux disease with esophagitis, without bleeding: Secondary | ICD-10-CM | POA: Diagnosis not present

## 2022-03-06 DIAGNOSIS — R748 Abnormal levels of other serum enzymes: Secondary | ICD-10-CM | POA: Diagnosis not present

## 2022-03-13 DIAGNOSIS — R748 Abnormal levels of other serum enzymes: Secondary | ICD-10-CM | POA: Diagnosis not present

## 2022-05-02 ENCOUNTER — Other Ambulatory Visit: Payer: Self-pay | Admitting: Obstetrics and Gynecology

## 2022-05-02 DIAGNOSIS — M8580 Other specified disorders of bone density and structure, unspecified site: Secondary | ICD-10-CM

## 2022-05-02 DIAGNOSIS — Z780 Asymptomatic menopausal state: Secondary | ICD-10-CM

## 2022-05-02 DIAGNOSIS — Z1231 Encounter for screening mammogram for malignant neoplasm of breast: Secondary | ICD-10-CM

## 2022-06-15 ENCOUNTER — Ambulatory Visit
Admission: RE | Admit: 2022-06-15 | Discharge: 2022-06-15 | Disposition: A | Discharge status: Home / Self Care | Payer: PPO | Source: Ambulatory Visit | Attending: Obstetrics and Gynecology | Admitting: Obstetrics and Gynecology

## 2022-06-15 DIAGNOSIS — Z1231 Encounter for screening mammogram for malignant neoplasm of breast: Secondary | ICD-10-CM | POA: Diagnosis not present

## 2022-06-28 DIAGNOSIS — E039 Hypothyroidism, unspecified: Secondary | ICD-10-CM | POA: Diagnosis not present

## 2022-06-28 DIAGNOSIS — R748 Abnormal levels of other serum enzymes: Secondary | ICD-10-CM | POA: Diagnosis not present

## 2022-06-28 DIAGNOSIS — R739 Hyperglycemia, unspecified: Secondary | ICD-10-CM | POA: Diagnosis not present

## 2022-06-28 DIAGNOSIS — R7301 Impaired fasting glucose: Secondary | ICD-10-CM | POA: Diagnosis not present

## 2022-06-28 DIAGNOSIS — E782 Mixed hyperlipidemia: Secondary | ICD-10-CM | POA: Diagnosis not present

## 2022-06-28 DIAGNOSIS — R7303 Prediabetes: Secondary | ICD-10-CM | POA: Diagnosis not present

## 2022-06-28 DIAGNOSIS — E7800 Pure hypercholesterolemia, unspecified: Secondary | ICD-10-CM | POA: Diagnosis not present

## 2022-06-28 DIAGNOSIS — K2100 Gastro-esophageal reflux disease with esophagitis, without bleeding: Secondary | ICD-10-CM | POA: Diagnosis not present

## 2022-07-04 DIAGNOSIS — K2100 Gastro-esophageal reflux disease with esophagitis, without bleeding: Secondary | ICD-10-CM | POA: Diagnosis not present

## 2022-07-04 DIAGNOSIS — E7849 Other hyperlipidemia: Secondary | ICD-10-CM | POA: Diagnosis not present

## 2022-07-04 DIAGNOSIS — M109 Gout, unspecified: Secondary | ICD-10-CM | POA: Diagnosis not present

## 2022-07-04 DIAGNOSIS — R7301 Impaired fasting glucose: Secondary | ICD-10-CM | POA: Diagnosis not present

## 2022-07-04 DIAGNOSIS — Z6833 Body mass index (BMI) 33.0-33.9, adult: Secondary | ICD-10-CM | POA: Diagnosis not present

## 2022-07-04 DIAGNOSIS — N189 Chronic kidney disease, unspecified: Secondary | ICD-10-CM | POA: Diagnosis not present

## 2022-07-04 DIAGNOSIS — R748 Abnormal levels of other serum enzymes: Secondary | ICD-10-CM | POA: Diagnosis not present

## 2022-07-04 DIAGNOSIS — R7303 Prediabetes: Secondary | ICD-10-CM | POA: Diagnosis not present

## 2022-07-04 DIAGNOSIS — E6609 Other obesity due to excess calories: Secondary | ICD-10-CM | POA: Diagnosis not present

## 2022-07-04 DIAGNOSIS — Z23 Encounter for immunization: Secondary | ICD-10-CM | POA: Diagnosis not present

## 2022-07-04 DIAGNOSIS — I10 Essential (primary) hypertension: Secondary | ICD-10-CM | POA: Diagnosis not present

## 2022-07-04 DIAGNOSIS — E039 Hypothyroidism, unspecified: Secondary | ICD-10-CM | POA: Diagnosis not present

## 2022-07-18 ENCOUNTER — Ambulatory Visit: Payer: PPO | Admitting: Obstetrics and Gynecology

## 2022-08-07 DIAGNOSIS — Z1159 Encounter for screening for other viral diseases: Secondary | ICD-10-CM | POA: Diagnosis not present

## 2022-08-07 DIAGNOSIS — E7801 Familial hypercholesterolemia: Secondary | ICD-10-CM | POA: Diagnosis not present

## 2022-08-07 DIAGNOSIS — E039 Hypothyroidism, unspecified: Secondary | ICD-10-CM | POA: Diagnosis not present

## 2022-08-07 DIAGNOSIS — E7800 Pure hypercholesterolemia, unspecified: Secondary | ICD-10-CM | POA: Diagnosis not present

## 2022-08-07 DIAGNOSIS — N183 Chronic kidney disease, stage 3 unspecified: Secondary | ICD-10-CM | POA: Diagnosis not present

## 2022-10-18 ENCOUNTER — Ambulatory Visit
Admission: RE | Admit: 2022-10-18 | Discharge: 2022-10-18 | Disposition: A | Discharge status: Home / Self Care | Payer: PPO | Source: Ambulatory Visit | Attending: Obstetrics and Gynecology | Admitting: Obstetrics and Gynecology

## 2022-10-18 DIAGNOSIS — Z780 Asymptomatic menopausal state: Secondary | ICD-10-CM | POA: Diagnosis not present

## 2022-10-18 DIAGNOSIS — M8580 Other specified disorders of bone density and structure, unspecified site: Secondary | ICD-10-CM

## 2022-10-18 DIAGNOSIS — M85852 Other specified disorders of bone density and structure, left thigh: Secondary | ICD-10-CM | POA: Diagnosis not present

## 2022-10-23 NOTE — Progress Notes (Signed)
72 y.o. G36P2002 Married Caucasian female here for med check and dexa results.    She is followed for lichen sclerosus and osteopenia.  Needs refill of Clobetasol. It does help her symptoms of itching.  States she has left hip pain.   Occasional reflux and uses over the counter medication.  Having problems with one of her dental implants.    No exercising regularly.   Taking calcium 600 mg + vit D twice daily.   She states her blood work is normal and does not have decreased renal function.   Narrative & Impression  EXAM: DUAL X-RAY ABSORPTIOMETRY (DXA) FOR BONE MINERAL DENSITY   IMPRESSION: Referring Physician:  Nunzio Cobbs Your patient completed a bone mineral density test using GE Lunar iDXA system (analysis version: 16). Technologist: [email or web address]   PATIENT: Name: Kristin Houston, Kristin Houston Patient ID: [id number] Birth Date: 29-Apr-1951 Height: 64.0 in. Sex: Female Measured: 10/18/2022 Weight: 190.8 lbs. Indications: Advanced Age, Caucasian, Estrogen Deficient, Hypothyroid, Hysterectomy, Levothyroxine, One ovary removed, Postmenopausal, Secondary Osteoporosis Fractures: None Treatments: Calcium (E943.0), Multivitamin   ASSESSMENT: The BMD measured at Femur Neck Left is 0.737 g/cm2 with a T-score of -2.2. This patient is considered osteopenic/low bone mass according to [facility]) criteria. The quality of the exam is good.   Site Region Measured Date Measured Age [age]A BMD Significant CHANGE T-score   DualFemur Neck Left 10/18/2021 72.3 -2.2 0.737 g/cm2 * DualFemur Neck Left  06/01/2020    69.9         -1.8    0.787 g/cm2   AP Spine L1-L4 10/18/2022 72.3 -0.7 1.094 g/cm2 * AP Spine L1-L4 06/01/2020 69.9 -1.2 1.042 g/cm2 *   DualFemur Total Mean 10/18/2022    72.3         -1.0    0.880 g/cm2 DualFemur Total Mean 06/01/2020 69.9 -0.9 0.896 g/cm2 *   World Health Organization [facility]) criteria for post-menopausal, Caucasian Women: Normal       T-score at  or above -1 SD Osteopenia   T-score between -1 and -2.5 SD Osteoporosis T-score at or below -2.5 SD   RECOMMENDATION: 1. All patients should optimize calcium and vitamin D intake. 2. Consider FDA-approved medical therapies in postmenopausal women and men aged 9 years and older, based on the following: a. A hip or vertebral (clinical or morphometric) fracture. b. T-score = -2.5 at the femoral neck or spine after appropriate evaluation to exclude secondary causes. c. Low bone mass (T-score between -1.0 and -2.5 at the femoral neck or spine) and a 10-year probability of a hip fracture = 3% or a 10-year probability of a major osteoporosis-related fracture = 20% based on the [place]-adapted WHO algorithm. d. Clinician judgment and/or patient preferences may indicate treatment for people with 10-year fracture probabilities above or below these levels.   FOLLOW-UP: Patients with diagnosis of osteoporosis or at high risk for fracture should have regular bone mineral density tests.? Patients eligible for Medicare are allowed routine testing every 2 years.? The testing frequency can be increased to one year for patients who have rapidly progressing disease, are receiving or discontinuing medical therapy to restore bone mass, or have additional risk factors.   I have reviewed this study and agree with the findings.   Mark A. Thornton Papas, M.D.   [facility]   FRAX* 10-year Probability of Fracture Based on femoral neck BMD: DualFemur (Left)   Major Osteoporotic Fracture: 12.8% Hip Fracture:  3.0% Population:                  [place] (Caucasian) Risk Factors:                Secondary Osteoporosis   *FRAX is a Materials engineer of the [employer] for Metabolic Bone Disease, a Geraldine (WHO) [facility].   ASSESSMENT: The probability of a major osteoporotic fracture is 12.8% within the next ten years. The probability  of a hip fracture is 3.0% within the next ten years.   I have reviewed this report and agree with the above findings.   Mark A. Thornton Papas, M.D.   [facility]     Electronically Signed   By: Lavonia Dana M.D.   On: 10/18/2022 08:59      PCP:   Consuello Masse, MD  Patient's last menstrual period was 09/25/1979.           Sexually active: Yes.    The current method of family planning is status post hysterectomy.    Exercising: No.     Smoker:  no  Health Maintenance: Pap:  2002 neg History of abnormal Pap:  no MMG:  06/15/22 Breast Density Category B, BI-RADS CATEGORY 1 Neg Colonoscopy:  10/25/09 BMD:   10/18/22  Result  osteopenic TDaP:  07/14/21 Gardasil:   no HIV: unsure Hep C: neg years ago per pt Screening Labs:  PCP   reports that she has never smoked. She has never used smokeless tobacco. She reports that she does not drink alcohol and does not use drugs.  Past Medical History:  Diagnosis Date   Elevated hemoglobin A1c 2017   5.9   Elevated liver enzymes 2017   AST - 70, ALT - 65   Elevated serum creatinine 2017   1.05   Gout of big toe    Hearing loss of both ears    Right ear hears less well.   Hyperlipidemia    Hypertension    Hypothyroidism    Not on medication.   Lichen sclerosus et atrophicus of the vulva 2017   Osteoporosis    Sebaceous cyst    I & D    Past Surgical History:  Procedure Laterality Date   BARTHOLIN GLAND CYST EXCISION N/A 1985   dental implants     TOTAL ABDOMINAL HYSTERECTOMY  1981   left salpingo-oophorectomy and appendectomy - dysfunctional uterine bleeding    Current Outpatient Medications  Medication Sig Dispense Refill   allopurinol (ZYLOPRIM) 300 MG tablet      atenolol (TENORMIN) 100 MG tablet Take 1 tablet by mouth 2 (two) times daily.  2   Calcium Carbonate-Vitamin D (CALCIUM-VITAMIN D) 500-200 MG-UNIT per tablet Take 1 tablet by mouth 2 (two) times daily with a meal.     cetirizine (ZYRTEC) 10 MG tablet Take  10 mg by mouth daily. Dosage not listed.     clobetasol ointment (TEMOVATE) [id number] % Apply 1 application topically 2 (two) times daily. Use for two weeks for flares of lichen sclerosus.  Use twice a week at bedtime for maintenance dosing. 60 g 1   hydrochlorothiazide (HYDRODIURIL) 25 MG tablet Take 25 mg by mouth daily.     levothyroxine (SYNTHROID) 100 MCG tablet Take by mouth.     levothyroxine (SYNTHROID) 88 MCG tablet Take 88 mcg by mouth every other day.     Multiple Vitamin (MULTIVITAMIN) tablet Take 1 tablet by mouth daily.     simvastatin (ZOCOR)  20 MG tablet Take 20 mg by mouth every evening.     No current facility-administered medications for this visit.    Family History  Problem Relation Age of Onset   Lymphoma Mother        Non Hodgkins   Hypertension Mother    Alzheimer's disease Mother    Osteoporosis Mother    ALS Father    Thrombophlebitis Other    Breast cancer Neg Hx     Review of Systems  All other systems reviewed and are negative.   Exam:   BP 126/82 (BP Location: Right Arm, Patient Position: Sitting, Cuff Size: Large)   Pulse 82   Ht 5' 3.5" (1.613 m)   Wt 188 lb (85.3 kg)   LMP 09/25/1979   SpO2 99%   BMI 32.78 kg/m     General appearance: alert, cooperative and appears stated age   Pelvic: External genitalia:  hypopigmented skin of vulva and ecchymotic areas just outtside the introitus and on the perineum.               No abnormal inguinal nodes palpated.              Urethra:  normal appearing urethra with no masses, tenderness or lesions              Bartholins and Skenes: normal                 Vagina: normal appearing vagina with normal color and discharge, no lesions              Cervix: absent                Bimanual Exam:  Uterus:  absent              Adnexa: no mass, fullness, tenderness           Chaperone was present for exam:  Declined by the patient.   Assessment:    Status post TAH/LSO/appy. Osteopenia of hip and spine.  Increase risk of hip fracture.  Lichen sclerosus.   Plan: Refill of Clobetasol ointment. Instructed in use.  BMD reviewed along with increased risk of hip fracture.  Rx options of Fosamax/Actonel/Boniva/Reclast/Prolia/Evenity reviewed.  Written information given.  She will consider her options.  Ca/vit D/weight bearing exercise discussed.  Next BMD in 2 years. FU end of Oct. For breast/pelvic exam.   After visit summary provided.   30 min  total time was spent for this patient encounter, including preparation, face-to-face counseling with the patient, coordination of care, and documentation of the encounter.

## 2022-11-06 ENCOUNTER — Ambulatory Visit (INDEPENDENT_AMBULATORY_CARE_PROVIDER_SITE_OTHER): Payer: PPO | Admitting: Obstetrics and Gynecology

## 2022-11-06 ENCOUNTER — Encounter: Payer: Self-pay | Admitting: Obstetrics and Gynecology

## 2022-11-06 VITALS — BP 126/82 | HR 82 | Ht 63.5 in | Wt 188.0 lb

## 2022-11-06 DIAGNOSIS — M8580 Other specified disorders of bone density and structure, unspecified site: Secondary | ICD-10-CM | POA: Diagnosis not present

## 2022-11-06 DIAGNOSIS — Z5181 Encounter for therapeutic drug level monitoring: Secondary | ICD-10-CM

## 2022-11-06 DIAGNOSIS — L900 Lichen sclerosus et atrophicus: Secondary | ICD-10-CM | POA: Diagnosis not present

## 2022-11-06 MED ORDER — CLOBETASOL PROPIONATE 0.05 % EX OINT: 1.0000 | TOPICAL_OINTMENT | Freq: Two times a day (BID) | CUTANEOUS | 1 refills | Status: DC

## 2022-11-06 NOTE — Patient Instructions (Signed)
Zoledronic Acid Injection (Bone Disorders) What is this medication? ZOLEDRONIC ACID (ZOE le dron ik AS id) prevents and treats osteoporosis. It may also be used to treat Paget's disease of the bone. It works by making your bones stronger and less likely to break (fracture). It belongs to a group of medications called bisphosphonates. This medicine may be used for other purposes; ask your health care provider or pharmacist if you have questions. COMMON BRAND NAME(S): Reclast What should I tell my care team before I take this medication? They need to know if you have any of these conditions: Bleeding disorder Cancer Dental disease Kidney disease Low levels of calcium in the blood Low red blood cell counts Lung or breathing disease, such as asthma Receiving steroids, such as dexamethasone or prednisone An unusual or allergic reaction to zoledronic acid, other medications, foods, dyes, or preservatives Pregnant or trying to get pregnant Breast-feeding How should I use this medication? This medication is injected into a vein. It is given by your care team in a hospital or clinic setting. A special MedGuide will be given to you before each treatment. Be sure to read this information carefully each time. Talk to your care team about the use of this medication in children. Special care may be needed. Overdosage: If you think you have taken too much of this medicine contact a poison control center or emergency room at once. NOTE: This medicine is only for you. Do not share this medicine with others. What if I miss a dose? Keep appointments for follow-up doses. It is important not to miss your dose. Call your care team if you are unable to keep an appointment. What may interact with this medication? Certain antibiotics given by injection Medications for pain and inflammation, such as ibuprofen, naproxen, NSAIDs Some diuretics, such as bumetanide, furosemide Teriparatide This list may not  describe all possible interactions. Give your health care provider a list of all the medicines, herbs, non-prescription drugs, or dietary supplements you use. Also tell them if you smoke, drink alcohol, or use illegal drugs. Some items may interact with your medicine. What should I watch for while using this medication? Visit your care team for regular checks on your progress. It may be some time before you see the benefit from this medication. Some people who take this medication have severe bone, joint, or muscle pain. This medication may also increase your risk for jaw problems or a broken thigh bone. Tell your care team right away if you have severe pain in your jaw, bones, joints, or muscles. Tell your care team if you have any pain that does not go away or that gets worse. You should make sure you get enough calcium and vitamin D while you are taking this medication. Discuss the foods you eat and the vitamins you take with your care team. You may need bloodwork while taking this medication. Tell your dentist and dental surgeon that you are taking this medication. You should not have major dental surgery while on this medication. See your dentist to have a dental exam and fix any dental problems before starting this medication. Take good care of your teeth while on this medication. Make sure you see your dentist for regular follow-up appointments. What side effects may I notice from receiving this medication? Side effects that you should report to your care team as soon as possible: Allergic reactions--skin rash, itching, hives, swelling of the face, lips, tongue, or throat Kidney injury--decrease in the amount of urine,   swelling of the ankles, hands, or feet Low calcium level--muscle pain or cramps, confusion, tingling, or numbness in the hands or feet Osteonecrosis of the jaw--pain, swelling, or redness in the mouth, numbness of the jaw, poor healing after dental work, unusual discharge from the  mouth, visible bones in the mouth Severe bone, joint, or muscle pain Side effects that usually do not require medical attention (report to your care team if they continue or are bothersome): Diarrhea Dizziness Headache Nausea Stomach pain Vomiting This list may not describe all possible side effects. Call your doctor for medical advice about side effects. You may report side effects to FDA at 1-800-FDA-1088. Where should I keep my medication? This medication is given in a hospital or clinic. It will not be stored at home. NOTE: This sheet is a summary. It may not cover all possible information. If you have questions about this medicine, talk to your doctor, pharmacist, or health care provider.  2023 Elsevier/Gold Standard (2021-10-27 00:00:00)   Denosumab Injection (Osteoporosis) What is this medication? DENOSUMAB (den oh SUE mab) prevents and treats osteoporosis. It works by Paramedic stronger and less likely to break (fracture). It is a monoclonal antibody. This medicine may be used for other purposes; ask your health care provider or pharmacist if you have questions. COMMON BRAND NAME(S): Prolia What should I tell my care team before I take this medication? They need to know if you have any of these conditions: Dental or gum disease, or plan to have dental surgery or a tooth pulled Infection Kidney disease Low levels of calcium or vitamin D in your blood On dialysis Poor nutrition Skin conditions Thyroid disease, or have had thyroid or parathyroid surgery Trouble absorbing minerals in your stomach or intestine An unusual or allergic reaction to denosumab, other medications, foods, dyes, or preservatives Pregnant or trying to get pregnant Breastfeeding How should I use this medication? This medication is injected under the skin. It is given by your care team in a hospital or clinic setting. A special MedGuide will be given to you before each treatment. Be sure to read  this information carefully each time. Talk to your care team about the use of this medication in children. Special care may be needed. Overdosage: If you think you have taken too much of this medicine contact a poison control center or emergency room at once. NOTE: This medicine is only for you. Do not share this medicine with others. What if I miss a dose? Keep appointments for follow-up doses. It is important not to miss your dose. Call your care team if you are unable to keep an appointment. What may interact with this medication? Do not take this medication with any of the following: Other medications that contain denosumab This medication may also interact with the following: Medications that lower your chance of fighting infection Steroid medications, such as prednisone or cortisone This list may not describe all possible interactions. Give your health care provider a list of all the medicines, herbs, non-prescription drugs, or dietary supplements you use. Also tell them if you smoke, drink alcohol, or use illegal drugs. Some items may interact with your medicine. What should I watch for while using this medication? Your condition will be monitored carefully while you are receiving this medication. You may need blood work while taking this medication. This medication may increase your risk of getting an infection. Call your care team for advice if you get a fever, chills, sore throat, or other symptoms  of a cold or flu. Do not treat yourself. Try to avoid being around people who are sick. Tell your dentist and dental surgeon that you are taking this medication. You should not have major dental surgery while on this medication. See your dentist to have a dental exam and fix any dental problems before starting this medication. Take good care of your teeth while on this medication. Make sure you see your dentist for regular follow-up appointments. You should make sure you get enough calcium and  vitamin D while you are taking this medication. Discuss the foods you eat and the vitamins you take with your care team. Talk to your care team if you are pregnant or think you might be pregnant. This medication can cause serious birth defects if taken during pregnancy and for 5 months after the last dose. You will need a negative pregnancy test before starting this medication. Contraception is recommended while taking this medication and for 5 months after the last dose. Your care team can help you find the option that works for you. Talk to your care team before breastfeeding. Changes to your treatment plan may be needed. What side effects may I notice from receiving this medication? Side effects that you should report to your care team as soon as possible: Allergic reactions--skin rash, itching, hives, swelling of the face, lips, tongue, or throat Infection--fever, chills, cough, sore throat, wounds that don't heal, pain or trouble when passing urine, general feeling of discomfort or being unwell Low calcium level--muscle pain or cramps, confusion, tingling, or numbness in the hands or feet Osteonecrosis of the jaw--pain, swelling, or redness in the mouth, numbness of the jaw, poor healing after dental work, unusual discharge from the mouth, visible bones in the mouth Severe bone, joint, or muscle pain Skin infection--skin redness, swelling, warmth, or pain Side effects that usually do not require medical attention (report these to your care team if they continue or are bothersome): Back pain Headache Joint pain Muscle pain Pain in the hands, arms, legs, or feet Runny or stuffy nose Sore throat This list may not describe all possible side effects. Call your doctor for medical advice about side effects. You may report side effects to FDA at 1-800-FDA-1088. Where should I keep my medication? This medication is given in a hospital or clinic. It will not be stored at home. NOTE: This sheet is a  summary. It may not cover all possible information. If you have questions about this medicine, talk to your doctor, pharmacist, or health care provider.  2023 Elsevier/Gold Standard (2022-01-22 00:00:00)   Alendronate Tablets What is this medication? ALENDRONATE (a LEN droe nate) prevents and treats osteoporosis. It may also be used to treat Paget disease of the bone. It works by Interior and spatial designer stronger and less likely to break (fracture). It belongs to a group of medications called bisphosphonates. This medicine may be used for other purposes; ask your health care provider or pharmacist if you have questions. COMMON BRAND NAME(S): Fosamax What should I tell my care team before I take this medication? They need to know if you have any of these conditions: Bleeding disorder Cancer Dental disease Difficulty swallowing Infection (fever, chills, cough, sore throat, pain or trouble passing urine) Kidney disease Low levels of calcium or other minerals in the blood Low red blood cell counts Receiving steroids like dexamethasone or prednisone Stomach or intestine problems Trouble sitting or standing for 30 minutes An unusual or allergic reaction to alendronate, other medications, foods,  dyes or preservatives Pregnant or trying to get pregnant Breast-feeding How should I use this medication? Take this medication by mouth with a full glass of water. Take it as directed on the prescription label at the same time every day. Take the dose right after waking up. Do not eat or drink anything before taking it. Do not take it with any other drink except water. Do not chew or crush the tablet. After taking it, do not eat breakfast, drink, or take any other medications or vitamins for at least 30 minutes. Sit or stand up for at least 30 minutes after you take it. Do not lie down. Keep taking it unless your care team tells you to stop. A special MedGuide will be given to you by the pharmacist with each  prescription and refill. Be sure to read this information carefully each time. Talk to your care team about the use of this medication in children. Special care may be needed. Overdosage: If you think you have taken too much of this medicine contact a poison control center or emergency room at once. NOTE: This medicine is only for you. Do not share this medicine with others. What if I miss a dose? If you take your medication once a day, skip it. Take your next dose at the scheduled time the next morning. Do not take two doses on the same day. If you take your medication once a week, take the missed dose on the morning after you remember. Do not take two doses on the same day. What may interact with this medication? Aluminum hydroxide Antacids Aspirin Calcium supplements Medications for inflammation like ibuprofen, naproxen, and others Iron supplements Magnesium supplements Vitamins with minerals This list may not describe all possible interactions. Give your health care provider a list of all the medicines, herbs, non-prescription drugs, or dietary supplements you use. Also tell them if you smoke, drink alcohol, or use illegal drugs. Some items may interact with your medicine. What should I watch for while using this medication? Visit your care team for regular checks on your progress. It may be some time before you see the benefit from this medication. Some people who take this medication have severe bone, joint, or muscle pain. This medication may also increase your risk for jaw problems or a broken thigh bone. Tell your care team right away if you have severe pain in your jaw, bones, joints, or muscles. Tell you care team if you have any pain that does not go away or that gets worse. Tell your dentist and dental surgeon that you are taking this medication. You should not have major dental surgery while on this medication. See your dentist to have a dental exam and fix any dental problems  before starting this medication. Take good care of your teeth while on this medication. Make sure you see your dentist for regular follow-up appointments. You should make sure you get enough calcium and vitamin D while you are taking this medication. Discuss the foods you eat and the vitamins you take with your care team. You may need blood work done while you are taking this medication. What side effects may I notice from receiving this medication? Side effects that you should report to your care team as soon as possible: Allergic reactions--skin rash, itching, hives, swelling of the face, lips, tongue, or throat Low calcium level--muscle pain or cramps, confusion, tingling, or numbness in the hands or feet Osteonecrosis of the jaw--pain, swelling, or redness in the mouth,  numbness of the jaw, poor healing after dental work, unusual discharge from the mouth, visible bones in the mouth Pain or trouble swallowing Severe bone, joint, or muscle pain Stomach bleeding--bloody or black, tar-like stools, vomiting blood or brown material that looks like coffee grounds Side effects that usually do not require medical attention (report to your care team if they continue or are bothersome): Constipation Diarrhea Nausea Stomach pain This list may not describe all possible side effects. Call your doctor for medical advice about side effects. You may report side effects to FDA at 1-800-FDA-1088. Where should I keep my medication? Keep out of the reach of children and pets. Store at room temperature between 15 and 30 degrees C (59 and 86 degrees F). Throw away any unused medication after the expiration date. NOTE: This sheet is a summary. It may not cover all possible information. If you have questions about this medicine, talk to your doctor, pharmacist, or health care provider.  2023 Elsevier/Gold Standard (2020-09-05 00:00:00)

## 2023-01-14 DIAGNOSIS — I10 Essential (primary) hypertension: Secondary | ICD-10-CM | POA: Diagnosis not present

## 2023-01-14 DIAGNOSIS — K2100 Gastro-esophageal reflux disease with esophagitis, without bleeding: Secondary | ICD-10-CM | POA: Diagnosis not present

## 2023-01-14 DIAGNOSIS — E7801 Familial hypercholesterolemia: Secondary | ICD-10-CM | POA: Diagnosis not present

## 2023-01-14 DIAGNOSIS — N183 Chronic kidney disease, stage 3 unspecified: Secondary | ICD-10-CM | POA: Diagnosis not present

## 2023-01-14 DIAGNOSIS — R7301 Impaired fasting glucose: Secondary | ICD-10-CM | POA: Diagnosis not present

## 2023-01-14 DIAGNOSIS — E039 Hypothyroidism, unspecified: Secondary | ICD-10-CM | POA: Diagnosis not present

## 2023-01-14 DIAGNOSIS — E7849 Other hyperlipidemia: Secondary | ICD-10-CM | POA: Diagnosis not present

## 2023-01-18 DIAGNOSIS — I10 Essential (primary) hypertension: Secondary | ICD-10-CM | POA: Diagnosis not present

## 2023-01-18 DIAGNOSIS — E039 Hypothyroidism, unspecified: Secondary | ICD-10-CM | POA: Diagnosis not present

## 2023-01-18 DIAGNOSIS — R7301 Impaired fasting glucose: Secondary | ICD-10-CM | POA: Diagnosis not present

## 2023-01-18 DIAGNOSIS — Z23 Encounter for immunization: Secondary | ICD-10-CM | POA: Diagnosis not present

## 2023-01-18 DIAGNOSIS — N189 Chronic kidney disease, unspecified: Secondary | ICD-10-CM | POA: Diagnosis not present

## 2023-01-18 DIAGNOSIS — M109 Gout, unspecified: Secondary | ICD-10-CM | POA: Diagnosis not present

## 2023-01-18 DIAGNOSIS — K2100 Gastro-esophageal reflux disease with esophagitis, without bleeding: Secondary | ICD-10-CM | POA: Diagnosis not present

## 2023-01-18 DIAGNOSIS — R748 Abnormal levels of other serum enzymes: Secondary | ICD-10-CM | POA: Diagnosis not present

## 2023-01-18 DIAGNOSIS — E6609 Other obesity due to excess calories: Secondary | ICD-10-CM | POA: Diagnosis not present

## 2023-01-18 DIAGNOSIS — R7981 Abnormal blood-gas level: Secondary | ICD-10-CM | POA: Diagnosis not present

## 2023-01-18 DIAGNOSIS — R7303 Prediabetes: Secondary | ICD-10-CM | POA: Diagnosis not present

## 2023-01-18 DIAGNOSIS — E7849 Other hyperlipidemia: Secondary | ICD-10-CM | POA: Diagnosis not present

## 2023-05-02 ENCOUNTER — Other Ambulatory Visit: Payer: Self-pay | Admitting: Obstetrics and Gynecology

## 2023-05-02 DIAGNOSIS — Z1231 Encounter for screening mammogram for malignant neoplasm of breast: Secondary | ICD-10-CM

## 2023-05-10 DIAGNOSIS — E7849 Other hyperlipidemia: Secondary | ICD-10-CM | POA: Diagnosis not present

## 2023-05-10 DIAGNOSIS — R7303 Prediabetes: Secondary | ICD-10-CM | POA: Diagnosis not present

## 2023-05-10 DIAGNOSIS — R739 Hyperglycemia, unspecified: Secondary | ICD-10-CM | POA: Diagnosis not present

## 2023-05-10 DIAGNOSIS — E7801 Familial hypercholesterolemia: Secondary | ICD-10-CM | POA: Diagnosis not present

## 2023-05-10 DIAGNOSIS — E039 Hypothyroidism, unspecified: Secondary | ICD-10-CM | POA: Diagnosis not present

## 2023-05-10 DIAGNOSIS — K2100 Gastro-esophageal reflux disease with esophagitis, without bleeding: Secondary | ICD-10-CM | POA: Diagnosis not present

## 2023-05-10 DIAGNOSIS — N183 Chronic kidney disease, stage 3 unspecified: Secondary | ICD-10-CM | POA: Diagnosis not present

## 2023-05-16 DIAGNOSIS — M109 Gout, unspecified: Secondary | ICD-10-CM | POA: Diagnosis not present

## 2023-05-16 DIAGNOSIS — K2100 Gastro-esophageal reflux disease with esophagitis, without bleeding: Secondary | ICD-10-CM | POA: Diagnosis not present

## 2023-05-16 DIAGNOSIS — E039 Hypothyroidism, unspecified: Secondary | ICD-10-CM | POA: Diagnosis not present

## 2023-05-16 DIAGNOSIS — R7981 Abnormal blood-gas level: Secondary | ICD-10-CM | POA: Diagnosis not present

## 2023-05-16 DIAGNOSIS — R7301 Impaired fasting glucose: Secondary | ICD-10-CM | POA: Diagnosis not present

## 2023-05-16 DIAGNOSIS — I129 Hypertensive chronic kidney disease with stage 1 through stage 4 chronic kidney disease, or unspecified chronic kidney disease: Secondary | ICD-10-CM | POA: Diagnosis not present

## 2023-05-16 DIAGNOSIS — E7849 Other hyperlipidemia: Secondary | ICD-10-CM | POA: Diagnosis not present

## 2023-05-16 DIAGNOSIS — I10 Essential (primary) hypertension: Secondary | ICD-10-CM | POA: Diagnosis not present

## 2023-05-16 DIAGNOSIS — E1122 Type 2 diabetes mellitus with diabetic chronic kidney disease: Secondary | ICD-10-CM | POA: Diagnosis not present

## 2023-05-16 DIAGNOSIS — N1831 Chronic kidney disease, stage 3a: Secondary | ICD-10-CM | POA: Diagnosis not present

## 2023-05-16 DIAGNOSIS — R748 Abnormal levels of other serum enzymes: Secondary | ICD-10-CM | POA: Diagnosis not present

## 2023-06-18 ENCOUNTER — Ambulatory Visit
Admission: RE | Admit: 2023-06-18 | Discharge: 2023-06-18 | Disposition: A | Discharge status: Home / Self Care | Payer: PPO | Source: Ambulatory Visit | Attending: Obstetrics and Gynecology | Admitting: Obstetrics and Gynecology

## 2023-06-18 DIAGNOSIS — Z1231 Encounter for screening mammogram for malignant neoplasm of breast: Secondary | ICD-10-CM

## 2023-06-21 DIAGNOSIS — Z2911 Encounter for prophylactic immunotherapy for respiratory syncytial virus (RSV): Secondary | ICD-10-CM | POA: Diagnosis not present

## 2023-06-21 DIAGNOSIS — Z23 Encounter for immunization: Secondary | ICD-10-CM | POA: Diagnosis not present

## 2023-07-03 DIAGNOSIS — Z23 Encounter for immunization: Secondary | ICD-10-CM | POA: Diagnosis not present

## 2023-07-04 NOTE — Progress Notes (Signed)
72 y.o. [id number] Married Caucasian female here for breast and pelvic exam.   She is followed for lichen sclerosus.  Uses clobetasol for a flare 3 times a year.   She has osteopenia.   Dealing with regulation of her thyroid.   Has chronic back pain.  Saw a chiropractor years ago.   She is doing a dental implant on Nov. 13th. She has done several in the past.   PCP: Estanislado Pandy, MD   Patient's last menstrual period was 09/25/1979.           Sexually active: Yes.    The current method of family planning is status post hysterectomy.    Exercising: No.   Smoker:  no  OB History  Gravida Para Term Preterm AB Living  2 2 2  0 0 2  SAB IAB Ectopic Multiple Live Births  0 0 0 0 2    # Outcome Date GA Lbr Len/2nd Weight Sex Type Anes PTL Lv  2 Term 72    F Vag-Spont   LIV  1 Term 46    F Vag-Spont   LIV     Health Maintenance: Pap:  2022 neg History of abnormal Pap:  no MMG: 06/18/23 Breast Density Category B, BI-RADS CATEGORY 1 Neg  Colonoscopy:  2016 normal.  BMD:  10/18/22  Result  osteopenic.  12.8%/3% risk of fracture.  HIV: unsure Hep C: neg years ago per pt  Immunization History  Administered Date(s) Administered   Fluad Quad(high Dose 65+) 07/08/2019   Influenza-Unspecified 07/03/2016, 07/11/2017, 06/24/2018   PFIZER(Purple Top)SARS-COV-2 Vaccination 11/08/2019, 11/28/2019   Tdap 12/06/2010, 07/14/2021    She states she had her flu, RSV, and Covid vaccines this fall.     reports that she has never smoked. She has never used smokeless tobacco. She reports that she does not drink alcohol and does not use drugs.  Past Medical History:  Diagnosis Date   Elevated hemoglobin A1c 2017   5.9   Elevated liver enzymes 2017   AST - 70, ALT - 65   Elevated serum creatinine 2017   1.05   Gout of big toe    Hearing loss of both ears    Right ear hears less well.   Hyperlipidemia    Hypertension    Hypothyroidism    Not on medication.   Lichen sclerosus  et atrophicus of the vulva 2017   Osteoporosis    Sebaceous cyst    I & D    Past Surgical History:  Procedure Laterality Date   BARTHOLIN GLAND CYST EXCISION N/A 1985   dental implants     TOTAL ABDOMINAL HYSTERECTOMY  1981   left salpingo-oophorectomy and appendectomy - dysfunctional uterine bleeding    Current Outpatient Medications  Medication Sig Dispense Refill   allopurinol (ZYLOPRIM) 300 MG tablet      atenolol (TENORMIN) 100 MG tablet Take 1 tablet by mouth 2 (two) times daily.  2   Calcium Carbonate-Vitamin D (CALCIUM-VITAMIN D) 500-200 MG-UNIT per tablet Take 1 tablet by mouth 2 (two) times daily with a meal.     cetirizine (ZYRTEC) 10 MG tablet Take 10 mg by mouth daily. Dosage not listed.     clobetasol ointment (TEMOVATE) 0.05 % Apply 1 Application topically 2 (two) times daily. Use for two weeks for flares of lichen sclerosus.  Use twice a week at bedtime for maintenance dosing. 60 g 1   hydrochlorothiazide (HYDRODIURIL) 25 MG tablet Take 25 mg by mouth daily.  levothyroxine (SYNTHROID) 112 MCG tablet Take 112 mcg by mouth daily.     Multiple Vitamin (MULTIVITAMIN) tablet Take 1 tablet by mouth daily.     simvastatin (ZOCOR) 20 MG tablet Take 20 mg by mouth every evening.     No current facility-administered medications for this visit.    Family History  Problem Relation Age of Onset   Lymphoma Mother        Non Hodgkins   Hypertension Mother    Alzheimer's disease Mother    Osteoporosis Mother    ALS Father    Thrombophlebitis Other    Breast cancer Neg Hx     Review of Systems  All other systems reviewed and are negative.   Exam:   BP 134/86 (BP Location: Right Arm, Patient Position: Sitting, Cuff Size: Normal)   Pulse 90   Ht 5\' 3"  (1.6 m)   Wt 185 lb (83.9 kg)   LMP 09/25/1979   SpO2 96%   BMI 32.77 kg/m     General appearance: alert, cooperative and appears stated age Head: normocephalic, without obvious abnormality, atraumatic Neck: no  adenopathy, supple, symmetrical, trachea midline and thyroid normal to inspection and palpation Lungs: clear to auscultation bilaterally Breasts: normal appearance, no masses or tenderness, No nipple retraction or dimpling, No nipple discharge or bleeding, No axillary adenopathy Heart: regular rate and rhythm Abdomen: soft, non-tender; no masses, no organomegaly Extremities: extremities normal, atraumatic, no cyanosis or edema Skin: skin color, texture, turgor normal. No rashes or lesions Lymph nodes: cervical, supraclavicular, and axillary nodes normal. Neurologic: grossly normal  Pelvic: External genitalia:  hypopigmentation of bilateral labia majora, perineum.  Blood blisters of left inferior labia majora.              No abnormal inguinal nodes palpated.              Urethra:  normal appearing urethra with no masses, tenderness or lesions              Bartholins and Skenes: normal                 Vagina: normal appearing vagina with normal color and discharge, no lesions              Cervix: absent              Pap taken: no Bimanual Exam:  Uterus:  absent              Adnexa: no mass, fullness, tenderness              Rectal exam: yes.  Confirms.              Anus:  normal sphincter tone, no lesions  Chaperone was present for exam:  Warren Lacy, CMA   Assessment  Encounter for breast and pelvic exam.  Status post TAH/LSO/appy. Osteopenia of hip.  3% risk of hip fracture.  Dental implant upcoming.  Hx hypothyroidism.  Followed by PCP.  Lichen sclerosus.  Encounter for medication monitoring.   Plan Mammogram screening discussed. Self breast awareness reviewed. Pap and HR HPV not indicated.  Guidelines for Calcium, Vitamin D, regular exercise program including cardiovascular and weight bearing exercise. Refill of clobetasol.  Instructed in use.  We reviewed her BMD and medications to reduce fracture risk:  Fosamax, Reclast, and Prolia.  We reviewed risks and benefits.  Based on  her upcoming dental implant and hx implants, we may want to reassess her bone density again  in Jan, 2026 before considering tx.  Fu yearly for check of lichen sclerosus and breast and pelvic exam in 2 years.   30 min  total time was spent for this patient encounter, including preparation, face-to-face counseling with the patient, coordination of care, and documentation of the encounter in addition to doing her breast and pelvic exam.

## 2023-07-18 ENCOUNTER — Encounter: Payer: Self-pay | Admitting: Obstetrics and Gynecology

## 2023-07-18 ENCOUNTER — Ambulatory Visit: Payer: PPO | Admitting: Obstetrics and Gynecology

## 2023-07-18 VITALS — BP 134/86 | HR 90 | Ht 63.0 in | Wt 185.0 lb

## 2023-07-18 DIAGNOSIS — L900 Lichen sclerosus et atrophicus: Secondary | ICD-10-CM

## 2023-07-18 DIAGNOSIS — Z5181 Encounter for therapeutic drug level monitoring: Secondary | ICD-10-CM | POA: Diagnosis not present

## 2023-07-18 DIAGNOSIS — Z01419 Encounter for gynecological examination (general) (routine) without abnormal findings: Secondary | ICD-10-CM | POA: Diagnosis not present

## 2023-07-18 DIAGNOSIS — M8580 Other specified disorders of bone density and structure, unspecified site: Secondary | ICD-10-CM | POA: Diagnosis not present

## 2023-07-18 MED ORDER — CLOBETASOL PROPIONATE 0.05 % EX OINT: 1.0000 | TOPICAL_OINTMENT | Freq: Two times a day (BID) | CUTANEOUS | 1 refills | Status: DC

## 2023-07-18 NOTE — Patient Instructions (Signed)

## 2023-07-26 DIAGNOSIS — Z20828 Contact with and (suspected) exposure to other viral communicable diseases: Secondary | ICD-10-CM | POA: Diagnosis not present

## 2023-07-26 DIAGNOSIS — Z6832 Body mass index (BMI) 32.0-32.9, adult: Secondary | ICD-10-CM | POA: Diagnosis not present

## 2023-07-26 DIAGNOSIS — J309 Allergic rhinitis, unspecified: Secondary | ICD-10-CM | POA: Diagnosis not present

## 2023-07-26 DIAGNOSIS — R030 Elevated blood-pressure reading, without diagnosis of hypertension: Secondary | ICD-10-CM | POA: Diagnosis not present

## 2023-07-26 DIAGNOSIS — R059 Cough, unspecified: Secondary | ICD-10-CM | POA: Diagnosis not present

## 2023-09-04 DIAGNOSIS — E7800 Pure hypercholesterolemia, unspecified: Secondary | ICD-10-CM | POA: Diagnosis not present

## 2023-09-04 DIAGNOSIS — E782 Mixed hyperlipidemia: Secondary | ICD-10-CM | POA: Diagnosis not present

## 2023-09-04 DIAGNOSIS — E039 Hypothyroidism, unspecified: Secondary | ICD-10-CM | POA: Diagnosis not present

## 2023-09-04 DIAGNOSIS — E1122 Type 2 diabetes mellitus with diabetic chronic kidney disease: Secondary | ICD-10-CM | POA: Diagnosis not present

## 2023-09-04 DIAGNOSIS — E7801 Familial hypercholesterolemia: Secondary | ICD-10-CM | POA: Diagnosis not present

## 2023-09-04 DIAGNOSIS — N183 Chronic kidney disease, stage 3 unspecified: Secondary | ICD-10-CM | POA: Diagnosis not present

## 2023-09-04 DIAGNOSIS — I129 Hypertensive chronic kidney disease with stage 1 through stage 4 chronic kidney disease, or unspecified chronic kidney disease: Secondary | ICD-10-CM | POA: Diagnosis not present

## 2023-09-04 DIAGNOSIS — E7849 Other hyperlipidemia: Secondary | ICD-10-CM | POA: Diagnosis not present

## 2023-09-20 DIAGNOSIS — E039 Hypothyroidism, unspecified: Secondary | ICD-10-CM | POA: Diagnosis not present

## 2023-09-20 DIAGNOSIS — M109 Gout, unspecified: Secondary | ICD-10-CM | POA: Diagnosis not present

## 2023-09-20 DIAGNOSIS — E1122 Type 2 diabetes mellitus with diabetic chronic kidney disease: Secondary | ICD-10-CM | POA: Diagnosis not present

## 2023-09-20 DIAGNOSIS — E876 Hypokalemia: Secondary | ICD-10-CM | POA: Diagnosis not present

## 2023-09-20 DIAGNOSIS — R7301 Impaired fasting glucose: Secondary | ICD-10-CM | POA: Diagnosis not present

## 2023-09-20 DIAGNOSIS — M5430 Sciatica, unspecified side: Secondary | ICD-10-CM | POA: Diagnosis not present

## 2023-09-20 DIAGNOSIS — E7849 Other hyperlipidemia: Secondary | ICD-10-CM | POA: Diagnosis not present

## 2023-09-20 DIAGNOSIS — I10 Essential (primary) hypertension: Secondary | ICD-10-CM | POA: Diagnosis not present

## 2023-09-20 DIAGNOSIS — I129 Hypertensive chronic kidney disease with stage 1 through stage 4 chronic kidney disease, or unspecified chronic kidney disease: Secondary | ICD-10-CM | POA: Diagnosis not present

## 2023-09-20 DIAGNOSIS — R7981 Abnormal blood-gas level: Secondary | ICD-10-CM | POA: Diagnosis not present

## 2023-09-20 DIAGNOSIS — R748 Abnormal levels of other serum enzymes: Secondary | ICD-10-CM | POA: Diagnosis not present

## 2023-09-20 DIAGNOSIS — Z0001 Encounter for general adult medical examination with abnormal findings: Secondary | ICD-10-CM | POA: Diagnosis not present

## 2023-10-30 DIAGNOSIS — H6691 Otitis media, unspecified, right ear: Secondary | ICD-10-CM | POA: Diagnosis not present

## 2023-11-06 DIAGNOSIS — R059 Cough, unspecified: Secondary | ICD-10-CM | POA: Diagnosis not present

## 2023-11-06 DIAGNOSIS — H6691 Otitis media, unspecified, right ear: Secondary | ICD-10-CM | POA: Diagnosis not present

## 2023-11-06 DIAGNOSIS — Z6831 Body mass index (BMI) 31.0-31.9, adult: Secondary | ICD-10-CM | POA: Diagnosis not present

## 2023-11-06 DIAGNOSIS — R197 Diarrhea, unspecified: Secondary | ICD-10-CM | POA: Diagnosis not present

## 2023-11-27 DIAGNOSIS — M25552 Pain in left hip: Secondary | ICD-10-CM | POA: Diagnosis not present

## 2023-11-27 DIAGNOSIS — G8929 Other chronic pain: Secondary | ICD-10-CM | POA: Diagnosis not present

## 2023-11-27 DIAGNOSIS — M1612 Unilateral primary osteoarthritis, left hip: Secondary | ICD-10-CM | POA: Diagnosis not present

## 2023-11-27 DIAGNOSIS — Z6831 Body mass index (BMI) 31.0-31.9, adult: Secondary | ICD-10-CM | POA: Diagnosis not present

## 2023-12-16 DIAGNOSIS — M1612 Unilateral primary osteoarthritis, left hip: Secondary | ICD-10-CM | POA: Diagnosis not present

## 2023-12-16 DIAGNOSIS — M4726 Other spondylosis with radiculopathy, lumbar region: Secondary | ICD-10-CM | POA: Diagnosis not present

## 2023-12-19 DIAGNOSIS — M1612 Unilateral primary osteoarthritis, left hip: Secondary | ICD-10-CM | POA: Diagnosis not present

## 2023-12-20 DIAGNOSIS — M545 Low back pain, unspecified: Secondary | ICD-10-CM | POA: Diagnosis not present

## 2023-12-30 DIAGNOSIS — M1612 Unilateral primary osteoarthritis, left hip: Secondary | ICD-10-CM | POA: Diagnosis not present

## 2023-12-30 DIAGNOSIS — M4726 Other spondylosis with radiculopathy, lumbar region: Secondary | ICD-10-CM | POA: Diagnosis not present

## 2024-01-20 DIAGNOSIS — E7849 Other hyperlipidemia: Secondary | ICD-10-CM | POA: Diagnosis not present

## 2024-01-20 DIAGNOSIS — R7303 Prediabetes: Secondary | ICD-10-CM | POA: Diagnosis not present

## 2024-01-20 DIAGNOSIS — E782 Mixed hyperlipidemia: Secondary | ICD-10-CM | POA: Diagnosis not present

## 2024-01-20 DIAGNOSIS — R739 Hyperglycemia, unspecified: Secondary | ICD-10-CM | POA: Diagnosis not present

## 2024-01-20 DIAGNOSIS — E039 Hypothyroidism, unspecified: Secondary | ICD-10-CM | POA: Diagnosis not present

## 2024-01-27 DIAGNOSIS — R7303 Prediabetes: Secondary | ICD-10-CM | POA: Diagnosis not present

## 2024-01-27 DIAGNOSIS — E039 Hypothyroidism, unspecified: Secondary | ICD-10-CM | POA: Diagnosis not present

## 2024-01-27 DIAGNOSIS — Z6831 Body mass index (BMI) 31.0-31.9, adult: Secondary | ICD-10-CM | POA: Diagnosis not present

## 2024-01-27 DIAGNOSIS — M545 Low back pain, unspecified: Secondary | ICD-10-CM | POA: Diagnosis not present

## 2024-01-27 DIAGNOSIS — E782 Mixed hyperlipidemia: Secondary | ICD-10-CM | POA: Diagnosis not present

## 2024-05-18 ENCOUNTER — Other Ambulatory Visit: Payer: Self-pay | Admitting: Family Medicine

## 2024-05-18 DIAGNOSIS — Z1231 Encounter for screening mammogram for malignant neoplasm of breast: Secondary | ICD-10-CM

## 2024-06-18 ENCOUNTER — Ambulatory Visit
Admission: RE | Admit: 2024-06-18 | Discharge: 2024-06-18 | Disposition: A | Discharge status: Home / Self Care | Source: Ambulatory Visit | Attending: Family Medicine | Admitting: Family Medicine

## 2024-06-18 DIAGNOSIS — Z1231 Encounter for screening mammogram for malignant neoplasm of breast: Secondary | ICD-10-CM

## 2024-06-22 ENCOUNTER — Ambulatory Visit: Payer: Self-pay | Admitting: Obstetrics and Gynecology

## 2024-07-23 ENCOUNTER — Ambulatory Visit: Payer: PPO | Admitting: Obstetrics and Gynecology

## 2024-07-27 DIAGNOSIS — R7981 Abnormal blood-gas level: Secondary | ICD-10-CM | POA: Diagnosis not present

## 2024-07-27 DIAGNOSIS — E039 Hypothyroidism, unspecified: Secondary | ICD-10-CM | POA: Diagnosis not present

## 2024-07-27 DIAGNOSIS — E7849 Other hyperlipidemia: Secondary | ICD-10-CM | POA: Diagnosis not present

## 2024-07-27 DIAGNOSIS — R748 Abnormal levels of other serum enzymes: Secondary | ICD-10-CM | POA: Diagnosis not present

## 2024-07-27 DIAGNOSIS — E876 Hypokalemia: Secondary | ICD-10-CM | POA: Diagnosis not present

## 2024-07-27 DIAGNOSIS — R7301 Impaired fasting glucose: Secondary | ICD-10-CM | POA: Diagnosis not present

## 2024-07-27 DIAGNOSIS — R739 Hyperglycemia, unspecified: Secondary | ICD-10-CM | POA: Diagnosis not present

## 2024-07-27 DIAGNOSIS — I10 Essential (primary) hypertension: Secondary | ICD-10-CM | POA: Diagnosis not present

## 2024-07-27 DIAGNOSIS — E1122 Type 2 diabetes mellitus with diabetic chronic kidney disease: Secondary | ICD-10-CM | POA: Diagnosis not present

## 2024-07-27 DIAGNOSIS — N1831 Chronic kidney disease, stage 3a: Secondary | ICD-10-CM | POA: Diagnosis not present

## 2024-07-27 DIAGNOSIS — E7800 Pure hypercholesterolemia, unspecified: Secondary | ICD-10-CM | POA: Diagnosis not present

## 2024-07-27 DIAGNOSIS — R7303 Prediabetes: Secondary | ICD-10-CM | POA: Diagnosis not present

## 2024-07-31 DIAGNOSIS — Z6831 Body mass index (BMI) 31.0-31.9, adult: Secondary | ICD-10-CM | POA: Diagnosis not present

## 2024-07-31 DIAGNOSIS — E785 Hyperlipidemia, unspecified: Secondary | ICD-10-CM | POA: Diagnosis not present

## 2024-07-31 DIAGNOSIS — R7303 Prediabetes: Secondary | ICD-10-CM | POA: Diagnosis not present

## 2024-07-31 DIAGNOSIS — M109 Gout, unspecified: Secondary | ICD-10-CM | POA: Diagnosis not present

## 2024-07-31 DIAGNOSIS — N1831 Chronic kidney disease, stage 3a: Secondary | ICD-10-CM | POA: Diagnosis not present

## 2024-07-31 DIAGNOSIS — Z23 Encounter for immunization: Secondary | ICD-10-CM | POA: Diagnosis not present

## 2024-07-31 DIAGNOSIS — I10 Essential (primary) hypertension: Secondary | ICD-10-CM | POA: Diagnosis not present

## 2024-08-13 ENCOUNTER — Encounter: Payer: Self-pay | Admitting: Obstetrics and Gynecology

## 2024-08-13 ENCOUNTER — Ambulatory Visit: Admitting: Obstetrics and Gynecology

## 2024-08-13 VITALS — BP 122/82 | HR 67 | Ht 63.5 in | Wt 179.0 lb

## 2024-08-13 DIAGNOSIS — M85852 Other specified disorders of bone density and structure, left thigh: Secondary | ICD-10-CM | POA: Diagnosis not present

## 2024-08-13 DIAGNOSIS — L900 Lichen sclerosus et atrophicus: Secondary | ICD-10-CM | POA: Diagnosis not present

## 2024-08-13 DIAGNOSIS — Z780 Asymptomatic menopausal state: Secondary | ICD-10-CM | POA: Diagnosis not present

## 2024-08-13 DIAGNOSIS — Z5181 Encounter for therapeutic drug level monitoring: Secondary | ICD-10-CM | POA: Diagnosis not present

## 2024-08-13 MED ORDER — CLOBETASOL PROPIONATE 0.05 % EX OINT: 1.0000 | TOPICAL_OINTMENT | Freq: Two times a day (BID) | CUTANEOUS | 1 refills | Status: AC

## 2024-08-13 NOTE — Patient Instructions (Addendum)
 Place the Clobetasol  ointment twice a day for 1 month.  Then reduce to clobetasol  ointment twice a week at bedtime.

## 2024-08-13 NOTE — Progress Notes (Signed)
 GYNECOLOGY  VISIT   HPI: 73 y.o.   Married  Caucasian female   G2P2002 with Patient's last menstrual period was 09/25/1979.   here for: 1 year follow up - Lichen sclerosus.   Uses clobetasol  about once a month for 2 - 3 days.    Has itching.     Also followed for osteopenia.    She has hx dental implants.  Having hip and back pain.  Affects her ability to do household chores.  Seeing ortho.   Study Result  Narrative & Impression  EXAM: DUAL X-RAY ABSORPTIOMETRY (DXA) FOR BONE MINERAL DENSITY   IMPRESSION: Referring Physician:  BOBIE FORBES CATHLYN JAYSON NIKKI Your patient completed a bone mineral density test using GE Lunar iDXA system (analysis version: 16). Technologist: lmn   PATIENT: Name: Kristin Houston, Kristin Houston Patient ID: [id number] Birth Date: 12/15/1950 Height: 64.0 in. Sex: Female Measured: 10/18/2022 Weight: 190.8 lbs. Indications: Advanced Age, Caucasian, Estrogen Deficient, Hypothyroid, Hysterectomy, Levothyroxine, One ovary removed, Postmenopausal, Secondary Osteoporosis Fractures: None Treatments: Calcium (E943.0), Multivitamin   ASSESSMENT: The BMD measured at Femur Neck Left is 0.737 g/cm2 with a T-score of -2.2. This patient is considered osteopenic/low bone mass according to World Health Organization [facility]) criteria. The quality of the exam is good.   Site Region Measured Date Measured Age [age]A BMD Significant CHANGE T-score   DualFemur Neck Left 10/18/20[age].3 -2.2 0.737 g/cm2 * DualFemur Neck Left  06/01/2020    69.9         -1.8    0.787 g/cm2   AP Spine L1-L4 10/18/2022 72.3 -0.7 1.094 g/cm2 * AP Spine L1-L4 06/01/2020 69.9 -1.2 1.042 g/cm2 *   DualFemur Total Mean 10/18/2022    72.3         -1.0    0.880 g/cm2 DualFemur Total Mean 06/01/2020 69.9 -0.9 0.896 g/cm2 *   World Health Organization [facility]) criteria for post-menopausal, Caucasian Women: Normal       T-score at or above -1 SD Osteopenia   T-score between -1 and -2.5 SD Osteoporosis T-score  at or below -2.5 SD   RECOMMENDATION: 1. All patients should optimize calcium and vitamin D intake. 2. Consider FDA-approved medical therapies in postmenopausal women and men aged 26 years and older, based on the following: a. A hip or vertebral (clinical or morphometric) fracture. b. T-score = -2.5 at the femoral neck or spine after appropriate evaluation to exclude secondary causes. c. Low bone mass (T-score between -1.0 and -2.5 at the femoral neck or spine) and a 10-year probability of a hip fracture = 3% or a 10-year probability of a major osteoporosis-related fracture = 20% based on the [place] -adapted WHO algorithm. d. Clinician judgment and/or patient preferences may indicate treatment for people with 10-year fracture probabilities above or below these levels.   FOLLOW-UP: Patients with diagnosis of osteoporosis or at high risk for fracture should have regular bone mineral density tests.? Patients eligible for Medicare are allowed routine testing every 2 years.? The testing frequency can be increased to one year for patients who have rapidly progressing disease, are receiving or discontinuing medical therapy to restore bone mass, or have additional risk factors.   I have reviewed this study and agree with the findings.   Mark A. Vida, M.D.   [facility]   FRAX* 10-year Probability of Fracture Based on femoral neck BMD: DualFemur (Left)   Major Osteoporotic Fracture: 12.8% Hip Fracture:  3.0% Population:                  [place]  (Caucasian) Risk Factors:                Secondary Osteoporosis   *FRAX is a armed forces logistics/support/administrative officer of the [employer] for Metabolic Bone Disease, a World Science Writer (WHO) [employer].   ASSESSMENT: The probability of a major osteoporotic fracture is 12.8% within the next ten years. The probability of a hip fracture is 3.0% within the next ten years.   I have reviewed this  report and agree with the above findings.   Mark A. Vida, M.D.   [facility]     Electronically Signed   By: Oneil Vida M.D.   On: 10/18/2022 08:59      GYNECOLOGIC HISTORY: Patient's last menstrual period was 09/25/1979. Contraception:  Hyst  Menopausal hormone therapy:  n/a Last 2 paps:  2022 neg  History of abnormal Pap or positive HPV:  no Mammogram:  06/18/24 Breast Density Cat B, BIRADS Cat 1 neg         OB History     Gravida  2   Para  2   Term  2   Preterm  0   AB  0   Living  2      SAB  0   IAB  0   Ectopic  0   Multiple  0   Live Births  2              Patient Active Problem List   Diagnosis Date Noted   S/P abdominal hysterectomy and left salpingo-oophorectomy, appy for DUB 1981 12/24/2012   HTN (hypertension) 12/24/2012   Pure hypercholesterolemia 12/24/2012   Hearing loss of both ears, rt worse than lft 12/24/2012    Past Medical History:  Diagnosis Date   Elevated hemoglobin A1c 2017   5.9   Elevated liver enzymes 2017   AST - 70, ALT - 65   Elevated serum creatinine 2017   1.05   Gout of big toe    Hearing loss of both ears    Right ear hears less well.   Hyperlipidemia    Hypertension    Hypothyroidism    Not on medication.   Lichen sclerosus et atrophicus of the vulva 2017   Osteoporosis    Sebaceous cyst    I & D    Past Surgical History:  Procedure Laterality Date   BARTHOLIN GLAND CYST EXCISION N/A 1985   dental implants     TOTAL ABDOMINAL HYSTERECTOMY  1981   left salpingo-oophorectomy and appendectomy - dysfunctional uterine bleeding    Current Outpatient Medications  Medication Sig Dispense Refill   allopurinol (ZYLOPRIM) 300 MG tablet      atenolol (TENORMIN) 100 MG tablet Take 1 tablet by mouth 2 (two) times daily.  2   Calcium Carbonate-Vitamin D (CALCIUM-VITAMIN D) 500-200 MG-UNIT per tablet Take 1 tablet by mouth 2 (two) times daily with a meal.     cetirizine (ZYRTEC) 10 MG tablet  Take 10 mg by mouth daily. Dosage not listed.     hydrochlorothiazide (HYDRODIURIL) 25 MG tablet Take 25 mg by mouth daily.     levothyroxine (SYNTHROID) 112 MCG tablet Take 112 mcg by mouth daily.     meloxicam (MOBIC) 15 MG tablet Take 15 mg by mouth daily.     Multiple Vitamin (MULTIVITAMIN) tablet Take 1 tablet by mouth daily.  simvastatin (ZOCOR) 20 MG tablet Take 20 mg by mouth every evening.     clobetasol  ointment (TEMOVATE ) 0.05 % Apply 1 Application topically 2 (two) times daily. Use for two weeks for flares of lichen sclerosus.  Use twice a week at bedtime for maintenance dosing. 60 g 1   No current facility-administered medications for this visit.     ALLERGIES: Other  Family History  Problem Relation Age of Onset   Lymphoma Mother        Non Hodgkins   Hypertension Mother    Alzheimer's disease Mother    Osteoporosis Mother    ALS Father    Thrombophlebitis Other    Breast cancer Neg Hx     Social History   Socioeconomic History   Marital status: Married    Spouse name: Not on file   Number of children: 2   Years of education: Not on file   Highest education level: Not on file  Occupational History    Employer: [employer]  Tobacco Use   Smoking status: Never   Smokeless tobacco: Never  Vaping Use   Vaping status: Never Used  Substance and Sexual Activity   Alcohol  use: No   Drug use: No   Sexual activity: Yes    Partners: Male    Birth control/protection: Surgical    Comment: TAH/LSO-less than 5, IC after 16, no STD, no abnormal pap, no DES  Other Topics Concern   Not on file  Social History Narrative   Not on file   Social Drivers of Health   Financial Resource Strain: Not on file  Food Insecurity: Not on file  Transportation Needs: Not on file  Physical Activity: Not on file  Stress: Not on file  Social Connections: Not on file  Intimate Partner Violence: Not on file    Review of Systems  All other systems reviewed and are  negative.   PHYSICAL EXAMINATION:   BP 122/82 (BP Location: Left Arm, Patient Position: Sitting)   Pulse 67   Ht 5' 3.5 (1.613 m)   Wt 179 lb (81.2 kg)   LMP 09/25/1979   SpO2 98%   BMI 31.21 kg/m     General appearance: alert, cooperative and appears stated age Head: Normocephalic, without obvious abnormality, atraumatic Neck: no adenopathy, supple, symmetrical, trachea midline and thyroid  normal to inspection and palpation Lungs: clear to auscultation bilaterally Heart: regular rate and rhythm Abdomen: soft, non-tender, no masses,  no organomegaly Extremities: extremities normal, atraumatic, no cyanosis or edema Skin: Skin color, texture, turgor normal. No rashes or lesions Lymph nodes: Cervical, supraclavicular, and axillary nodes normal. No abnormal inguinal nodes palpated Neurologic: Grossly normal  Pelvic: External genitalia:  white epithelium of the labia minora and majora, perineum with scattered blood blisters.               Urethra:  normal appearing urethra with no masses, tenderness or lesions              Bartholins and Skenes: normal                 Vagina: normal appearing vagina with normal color and discharge, no lesions              Cervix: absent                Bimanual Exam:  Uterus:  absent              Adnexa: no mass, fullness, tenderness  Chaperone was present for exam:  Kari HERO, CMA  ASSESSMENT:  Lichen sclerosus. Active lesions noted.  Encounter for medication monitoring.  Menopausal female.  Osteopenia.  Increased risk of hip fracture.  No current treatment.   PLAN:  Lichen sclerosus reviewed.  Relationship of lichen sclerosus with vulvar scarring, dysplasia and cancer reviewed.  Clobetasol  refill. Use twice weekly for 4 weeks and then reduce to twice weekly for maintenance.   Recheck in 4 - 6 weeks.  Prior BMD and fracture risk discussed.  Calcium, vit D, and weight bearing exercise discussed.  Fall risk reduction reviewed.  BMD at  [facility].  Patient will call to schedule.  Due at the end of January.  FU in 1 year for breast and pelvic exam and recheck.

## 2024-09-29 ENCOUNTER — Encounter: Payer: Self-pay | Admitting: Obstetrics and Gynecology

## 2024-09-29 ENCOUNTER — Ambulatory Visit (INDEPENDENT_AMBULATORY_CARE_PROVIDER_SITE_OTHER): Admitting: Obstetrics and Gynecology

## 2024-09-29 VITALS — BP 124/82 | HR 64

## 2024-09-29 DIAGNOSIS — Z5181 Encounter for therapeutic drug level monitoring: Secondary | ICD-10-CM

## 2024-09-29 DIAGNOSIS — L900 Lichen sclerosus et atrophicus: Secondary | ICD-10-CM

## 2024-09-29 NOTE — Progress Notes (Unsigned)
 "  GYNECOLOGY  VISIT   HPI: 74 y.o.   Married  Caucasian female   G2P2002 with Patient's last menstrual period was 09/25/1979.   here for: Med check - clobetasol  tx of lichen sclerosus.  Using clobetasol  twice a day.   No itching.   No symptoms.   Biopsy done 06/29/16 showing lichen sclerosus.   Hip pain is waking her up.  Saw ortho.  Had an MRI and received a steroid injection.   Has upcoming bone density in February due to osteopenia.  GYNECOLOGIC HISTORY: Patient's last menstrual period was 09/25/1979. Contraception:  Hyst  Menopausal hormone therapy:  n/a Last 2 paps:  2022 neg  History of abnormal Pap or positive HPV:  no Mammogram:  06/18/24 Breast Density Cat B, BIRADS Cat 1 neg          OB History     Gravida  2   Para  2   Term  2   Preterm  0   AB  0   Living  2      SAB  0   IAB  0   Ectopic  0   Multiple  0   Live Births  2              Patient Active Problem List   Diagnosis Date Noted   S/P abdominal hysterectomy and left salpingo-oophorectomy, appy for DUB 1981 12/24/2012   HTN (hypertension) 12/24/2012   Pure hypercholesterolemia 12/24/2012   Hearing loss of both ears, rt worse than lft 12/24/2012    Past Medical History:  Diagnosis Date   Elevated hemoglobin A1c 2017   5.9   Elevated liver enzymes 2017   AST - 70, ALT - 65   Elevated serum creatinine 2017   1.05   Gout of big toe    Hearing loss of both ears    Right ear hears less well.   Hyperlipidemia    Hypertension    Hypothyroidism    Not on medication.   Lichen sclerosus et atrophicus of the vulva 2017   Osteoporosis    Sebaceous cyst    I & D    Past Surgical History:  Procedure Laterality Date   BARTHOLIN GLAND CYST EXCISION N/A 1985   dental implants     TOTAL ABDOMINAL HYSTERECTOMY  1981   left salpingo-oophorectomy and appendectomy - dysfunctional uterine bleeding    Current Outpatient Medications  Medication Sig Dispense Refill   allopurinol  (ZYLOPRIM) 300 MG tablet      atenolol (TENORMIN) 100 MG tablet Take 1 tablet by mouth 2 (two) times daily.  2   Calcium Carbonate-Vitamin D (CALCIUM-VITAMIN D) 500-200 MG-UNIT per tablet Take 1 tablet by mouth 2 (two) times daily with a meal.     cetirizine (ZYRTEC) 10 MG tablet Take 10 mg by mouth daily. Dosage not listed.     clobetasol  ointment (TEMOVATE ) 0.05 % Apply 1 Application topically 2 (two) times daily. Use for two weeks for flares of lichen sclerosus.  Use twice a week at bedtime for maintenance dosing. 60 g 1   hydrochlorothiazide (HYDRODIURIL) 25 MG tablet Take 25 mg by mouth daily.     levothyroxine (SYNTHROID) 112 MCG tablet Take 112 mcg by mouth daily.     meloxicam (MOBIC) 15 MG tablet Take 15 mg by mouth daily.     Multiple Vitamin (MULTIVITAMIN) tablet Take 1 tablet by mouth daily.     potassium chloride SA (KLOR-CON M) 20 MEQ tablet Take  20 mEq by mouth daily.     simvastatin (ZOCOR) 20 MG tablet Take 20 mg by mouth every evening.     No current facility-administered medications for this visit.     ALLERGIES: Other  Family History  Problem Relation Age of Onset   Lymphoma Mother        Non Hodgkins   Hypertension Mother    Alzheimer's disease Mother    Osteoporosis Mother    ALS Father    Thrombophlebitis Other    Breast cancer Neg Hx     Social History   Socioeconomic History   Marital status: Married    Spouse name: Not on file   Number of children: 2   Years of education: Not on file   Highest education level: Not on file  Occupational History    Employer: [employer]  Tobacco Use   Smoking status: Never   Smokeless tobacco: Never  Vaping Use   Vaping status: Never Used  Substance and Sexual Activity   Alcohol  use: No   Drug use: No   Sexual activity: Yes    Partners: Male    Birth control/protection: Surgical    Comment: TAH/LSO-less than 5, IC after 16, no STD, no abnormal pap, no DES  Other Topics Concern   Not on file  Social  History Narrative   Not on file   Social Drivers of Health   Tobacco Use: Low Risk (09/29/2024)   Patient History    Smoking Tobacco Use: Never    Smokeless Tobacco Use: Never    Passive Exposure: Not on file  Financial Resource Strain: Not on file  Food Insecurity: Not on file  Transportation Needs: Not on file  Physical Activity: Not on file  Stress: Not on file  Social Connections: Not on file  Intimate Partner Violence: Not on file  Depression (EYV7-0): Not on file  Alcohol  Screen: Not on file  Housing: Not on file  Utilities: Not on file  Health Literacy: Not on file    Review of Systems  All other systems reviewed and are negative.   PHYSICAL EXAMINATION:   BP 124/82 (BP Location: Left Arm, Patient Position: Sitting)   Pulse 64   LMP 09/25/1979   SpO2 97%     General appearance: alert, cooperative and appears stated age  Pelvic: External genitalia: minimal hypopigmentation of the skin and and one area of blood blister noted of labia majora.               Urethra:  normal appearing urethra with no masses, tenderness or lesions  Chaperone was present for exam:  Kari HERO, CMA  ASSESSMENT:  Lichen sclerosus.  Improved with use of clobetasol .  Encounter for medication monitoring.  Osteopenia.   PLAN:  Continue Clobetasol  twice weekly.  We discussed lichen sclerosus, its waxing and waning nature, and potential association with vulvar dysplasia and vulvar cancer.  Yearly follow up of lichen sclerosus recommended. Bone density upcoming.   22 min  total time was spent for this patient encounter, including preparation, face-to-face counseling with the patient, coordination of care, and documentation of the encounter.    "

## 2024-10-26 ENCOUNTER — Ambulatory Visit (HOSPITAL_COMMUNITY)
Admission: RE | Admit: 2024-10-26 | Discharge: 2024-10-26 | Disposition: A | Discharge status: Home / Self Care | Source: Ambulatory Visit | Attending: Obstetrics and Gynecology | Admitting: Obstetrics and Gynecology

## 2024-10-26 DIAGNOSIS — Z780 Asymptomatic menopausal state: Secondary | ICD-10-CM

## 2024-10-28 ENCOUNTER — Ambulatory Visit: Payer: Self-pay | Admitting: Obstetrics and Gynecology

## 2025-08-16 ENCOUNTER — Encounter: Admitting: Obstetrics and Gynecology
